# Patient Record
Sex: Male | Born: 1960
Health system: Southern US, Community
[De-identification: ages and names within clinical notes are randomized; demographics above are authoritative.]

## PROBLEM LIST (undated history)

## (undated) DIAGNOSIS — G5603 Carpal tunnel syndrome, bilateral upper limbs: Secondary | ICD-10-CM

## (undated) DIAGNOSIS — J301 Allergic rhinitis due to pollen: Secondary | ICD-10-CM

## (undated) DIAGNOSIS — E119 Type 2 diabetes mellitus without complications: Secondary | ICD-10-CM

## (undated) HISTORY — DX: Allergic rhinitis due to pollen: J30.1

## (undated) HISTORY — PX: POLYPECTOMY: SHX149

## (undated) HISTORY — DX: Type 2 diabetes mellitus without complications: E11.9

## (undated) HISTORY — DX: Carpal tunnel syndrome, bilateral upper limbs: G56.03

---
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
## 2000-03-20 ENCOUNTER — Encounter: Payer: Self-pay | Admitting: Family Medicine

## 2000-03-20 ENCOUNTER — Encounter: Admission: RE | Admit: 2000-03-20 | Discharge: 2000-03-20 | Payer: Self-pay | Admitting: Family Medicine

## 2000-03-22 ENCOUNTER — Encounter: Admission: RE | Admit: 2000-03-22 | Discharge: 2000-03-22 | Payer: Self-pay | Admitting: Family Medicine

## 2000-03-22 ENCOUNTER — Encounter: Payer: Self-pay | Admitting: Family Medicine

## 2002-10-01 HISTORY — PX: FOOT SURGERY: SHX648

## 2005-08-09 ENCOUNTER — Other Ambulatory Visit: Payer: Self-pay

## 2005-08-09 ENCOUNTER — Inpatient Hospital Stay: Payer: Self-pay | Admitting: Internal Medicine

## 2005-12-06 ENCOUNTER — Ambulatory Visit: Payer: Self-pay | Admitting: Internal Medicine

## 2007-12-04 ENCOUNTER — Ambulatory Visit: Payer: Self-pay | Admitting: Family Medicine

## 2007-12-11 ENCOUNTER — Telehealth (INDEPENDENT_AMBULATORY_CARE_PROVIDER_SITE_OTHER): Payer: Self-pay | Admitting: Internal Medicine

## 2008-07-01 ENCOUNTER — Telehealth: Payer: Self-pay | Admitting: Internal Medicine

## 2008-07-02 ENCOUNTER — Ambulatory Visit: Payer: Self-pay | Admitting: Family Medicine

## 2008-07-02 DIAGNOSIS — G5600 Carpal tunnel syndrome, unspecified upper limb: Secondary | ICD-10-CM | POA: Insufficient documentation

## 2008-07-07 ENCOUNTER — Encounter: Payer: Self-pay | Admitting: Family Medicine

## 2009-12-26 ENCOUNTER — Ambulatory Visit: Payer: Self-pay | Admitting: Family Medicine

## 2009-12-30 ENCOUNTER — Telehealth: Payer: Self-pay | Admitting: Family Medicine

## 2010-01-04 ENCOUNTER — Telehealth: Payer: Self-pay | Admitting: Family Medicine

## 2010-01-16 ENCOUNTER — Ambulatory Visit: Payer: Self-pay | Admitting: Internal Medicine

## 2010-01-25 ENCOUNTER — Telehealth: Payer: Self-pay | Admitting: Internal Medicine

## 2010-05-24 ENCOUNTER — Ambulatory Visit: Payer: Self-pay | Admitting: Internal Medicine

## 2010-05-29 ENCOUNTER — Telehealth: Payer: Self-pay | Admitting: Internal Medicine

## 2010-06-29 ENCOUNTER — Ambulatory Visit: Payer: Self-pay | Admitting: Pulmonary Disease

## 2010-06-29 DIAGNOSIS — R05 Cough: Secondary | ICD-10-CM

## 2010-06-29 DIAGNOSIS — R059 Cough, unspecified: Secondary | ICD-10-CM | POA: Insufficient documentation

## 2010-07-13 ENCOUNTER — Ambulatory Visit: Payer: Self-pay | Admitting: Pulmonary Disease

## 2010-10-31 NOTE — Assessment & Plan Note (Signed)
Summary: consult for chronic cough   Visit Type:  Initial Consult Copy to:  Tillman Abide MD Primary Provider/Referring Provider:  Tillman Abide MD  CC:  chronic cough.  History of Present Illness: The pt is a 50y/o male who I have been asked to see for chronic cough.  He has had a cough for 3-4 years, and when usually comes on it takes about 3 mos to go away.  The trigger is usually temperature changes, dust exposures, strong odors, and then the cough escalates from there.  This particular cough he has currently started about 8 weeks ago, and he has been treated with amoxacillin, avelox since that time.  His cough is productive of very little mucus when it occurs, but can sometimes be discolored.  He currently rates his cough a 2/10, with 10 being the worse most recently.  The pt has constant throat clearing during the interview, and states this is an ongoing issue as well.  He describes a tickle in his throat.  He denies postnasal drip or sinus symptoms, and does not think he has reflux disease.  He has no h/o childhood asthma, and has not seen any change in his exertional tolerance.   Current Medications (verified): 1)  Claritin 10 Mg Tabs (Loratadine) .... 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 CTS, B, confirmed on EMG  Past Surgical History: Appendectomy  Family History: Reviewed history from 01/16/2010 and no changes required. Heart disease--mother,father Face cancer--father  Social History: Reviewed history from 01/16/2010 and no changes required. 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  The patient denies shortness of breath at rest, coughing up blood, chest pain, irregular heartbeats, acid heartburn, indigestion, loss of appetite, abdominal pain, difficulty swallowing, sore throat, tooth/dental problems, headaches, nasal congestion/difficulty breathing through nose, sneezing, itching, ear ache, anxiety, depression, hand/feet swelling, joint stiffness or pain, rash, and fever.    Vital Signs:  Patient profile:   50 year old male Height:      71 inches Weight:      321.13 pounds O2 Sat:      96 % on Room air Temp:     98.2 degrees F oral Pulse rate:   78 / minute BP sitting:   118 / 82  (right arm) Cuff size:   large  Vitals Entered By: Carver Fila (June 29, 2010 11:18 AM)  O2 Flow:  Room air CC: chronic cough Comments meds and allergies updated Phone number updated Carver Fila  June 29, 2010 11:23 AM    Physical Exam  General:  obese male in nad Eyes:  PERRLA and EOMI.   Nose:  patent without discharge, no purulence or inflammatory changes. Mouth:  no lesions or exudates. Neck:  no jvd, tmg, LN Lungs:  totally clear to auscultation no wheezing or rhonchi Heart:  rrr, no mrg Abdomen:  soft and nontender, bs+ Extremities:  no significant edema or cyanosis  pulses intact distally Neurologic:  alert and oriented, moves all 4.   Impression & Recommendations:  Problem # 1:  COUGH, CHRONIC (  ICD-786.2) the pt's cough sounds more upper airway in origin than lower.  He has a tickle in his throat, has chronic throat clearing, and his history of how this occurs sounds classical for a cyclical cough.  The pt may be one of these who has a hypersensitized upper airway, and propagates his cough once he starts due to any environmental exposure or overuse of voice.  Postnasal drip and reflux are also common  hypersensitizers of the upper airway.  I would like to treat him with the cyclical cough protocol, and have also reviewed the various behavioral therapies he will need as well.  Will also treat emperically for possible postnasal drip and reflux.  Medications Added to Medication List This Visit: 1)  Claritin 10 Mg Tabs (Loratadine) .... 2 tablets once daily 2)  Tussicaps 10-8 Mg Xr12h-cap (Hydrocod polst-chlorphen polst) .... One every 12 hrs as needed 3)  Tessalon Perles 100 Mg Caps (Benzonatate) .... One to two by mouth 3-4 times daily  Other Orders: T-2 View CXR (71020TC) Consultation Level V (13086)  Patient Instructions: 1)  see cyclical cough protocol...see sheet. 2)  once protocol is done after 3 days, slowly get back into normal life with voice limitation and hard candy 3)  dexilant 60mg  one each am until next visit. 4)  once the 3 days on protocol are completed, get chlorpheniramine 8mg  one at bedtime until next visit with me.  stop claritin for now 5)  will check cxr today 6)  followup with me in 2 weeks.  Prescriptions: TESSALON PERLES 100 MG  CAPS (BENZONATATE) One to two by mouth 3-4 times daily  #30 x 1   Entered and Authorized by:   Barbaraann Share MD   Signed by:   Barbaraann Share MD on 06/29/2010   Method used:   Print then Give to Patient   RxID:   [id number] TUSSICAPS 10-8 MG XR12H-CAP (HYDROCOD POLST-CHLORPHEN POLST) one every 12 hrs as needed  #10 x 0   Entered and Authorized by:   Barbaraann Share MD   Signed by:   Barbaraann Share MD on 06/29/2010   Method used:   Print then Give to Patient   RxID:   [id number]

## 2010-10-31 NOTE — Assessment & Plan Note (Signed)
Summary: 2:00 COUGH X 1 MTH/CLE   Vital Signs:  Patient profile:   50 year old male Weight:      329 pounds O2 Sat:      97 % on Room air Temp:     98.2 degrees F tympanic Pulse rate:   96 / minute Pulse rhythm:   regular Resp:     16 per minute BP sitting:   160 / 90  (left arm) Cuff size:   large  Vitals Entered By: Mervin Hack CMA Duncan Dull) (January 16, 2010 2:06 PM)  O2 Flow:  Room air CC: cough   History of Present Illness: Having cough for about 1 month Productive of green sputum notes it worse when there are air temperature changes No real sore throat No fever No SOB No history of asthma that he knows of  tried mucinex D regularly--seems to help loosen the phlegm given z-pak then doxy seems some worse at night--out coaching baseball  fluids seem to thicken as he swallows them--like he needs to spit it out  Had been doing elliptical and weights till about 2 months ago (then had schedule change)   Preventive Screening-Counseling & Management  Alcohol-Tobacco     Smoking Status: never  Allergies: No Known Drug Allergies  Past History:  Past Medical History: Reviewed history from 07/02/2008 and no changes required. 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  NAD Fairly constant coarse cough Head:  no sinus tenderness Ears:  R ear normal and L ear normal.   Nose:  mild congestion only Mouth:  no erythema and no exudates.   Neck:  supple, no masses, no thyromegaly, no carotid bruits, and no cervical lymphadenopathy.   Lungs:  normal respiratory effort, no intercostal retractions, no accessory muscle use, no dullness, no  crackles, and no wheezes.   Decreased breath sounds but clear Additional Exam:  CXR shows prominent bronchial markings but no pneumonia Spirometry is normal   Impression & Recommendations:  Problem # 1:  COUGH (ICD-786.2) Assessment New Persistent is concerning no troubling findings on CXR Spirometry normal but still may have bronchospasm component  if not better with this, would refer to pulmonary  Orders: Spirometry w/Graph (94010) CXR- 2view (CXR)  Complete Medication List: 1)  Levaquin 750 Mg Tabs (Levofloxacin) .Marland Kitchen.. 1 tab daily for bronchitis 2)  Prednisone 20 Mg Tabs (Prednisone) .... 2 tablets daily for cough. 3)  Tramadol Hcl 50 Mg Tabs (Tramadol hcl) .Marland Kitchen.. 1-2 tabs at bedtime as needed for cough  Patient Instructions: 1)  Please call if you are not improving in the next week---we will make referral to lung specialist 2)  Please try loratadine 10mg  1-2 daily and cetirizine 10mg  daily for allergies as needed Prescriptions: TRAMADOL HCL 50 MG TABS (TRAMADOL HCL) 1-2 tabs at bedtime as needed for cough  #30 x 0   Entered and Authorized by:   Cindee Salt MD   Signed by:   Cindee Salt MD on 01/16/2010   Method used:   Electronically to        [facility]. # [phone]* (retail)       [address]  [place], [place]  [address]       Ph: [phone]       Fax: [phone]   RxID:   [id number] PREDNISONE 20 MG TABS (PREDNISONE) 2 tablets daily for cough.  #10 x 0   Entered and Authorized by:   Cindee Salt MD   Signed by:   Cindee Salt MD on 01/16/2010   Method used:   Electronically to        [facility]. # [id number]* (retail)       [address]       [place], [place]  [address]       Ph: [phone]       Fax: [phone]   RxID:   [id number] LEVAQUIN 750 MG TABS (LEVOFLOXACIN) 1 tab daily for bronchitis  #7 x 0   Entered and Authorized by:   Cindee Salt MD   Signed by:   Cindee Salt MD on 01/16/2010   Method used:   Electronically to        [facility]. # [phone]* (retail)       [address]       [place], [place]  [address]       Ph: [phone]       Fax: [phone]   RxID:   [id number]   Current Allergies (reviewed today): No known allergies

## 2010-10-31 NOTE — Progress Notes (Signed)
Summary: Cough  Phone Note Call from Patient Call back at [facility] Phone [phone]   Caller: Patient Call For: Dr.Demaya Hardge Summary of Call: pt calling stating that he still had a terrible cough and coughing up "green stuff" pt is still taking 2 Tramadol at night and finished all other abx. He would like referral to lung specialist. Please advise. Initial call taken by: Mervin Hack CMA Duncan Dull),  January 25, 2010 3:04 PM  Follow-up for Phone Call        please refer to  pulmonary Follow-up by: Cindee Salt MD,  January 25, 2010 8:16 PM

## 2010-10-31 NOTE — Progress Notes (Signed)
Summary: tongue is irritated  Phone Note Call from Patient Call back at Home Phone [phone]   Caller: Spouse Call For: Derrick Beat MD Summary of Call: Pt has been taking doxycycline since last friday.  On saturday his tongue started getting really sore and sides are yellow.  Feels raw.  Could this be a side effect of the doxy?  He still has cough and congestion, uses [facility] in [place]. Initial call taken by: Lowella Petties CMA,  January 04, 2010 2:24 PM  Follow-up for Phone Call        Throat and tongue irritation is reported.  Uncommon, but theoretically possible. May or may not be related.   Ok to stop medication. He has completed a full course of azithromycin and several days of additional doxy.   post-inflammatory resp symptoms can be expected for 2 weeks after patient "feels better." As long as afebrile, no additional ABX needed Follow-up by: Derrick Beat MD,  January 04, 2010 3:00 PM  Additional Follow-up for Phone Call Additional follow up Details #1::        Advised pt. Additional Follow-up by: Lowella Petties CMA,  January 04, 2010 3:03 PM

## 2010-10-31 NOTE — Assessment & Plan Note (Signed)
Summary: COUGHING UP GREEN MUCUS / LFW   Vital Signs:  Patient profile:   50 year old male Weight:      324 pounds Temp:     98.1 degrees F oral Pulse rate:   82 / minute Pulse rhythm:   regular Resp:     12 per minute BP sitting:   140 / 100  (left arm) Cuff size:   large  Vitals Entered By: Mervin Hack CMA Duncan Dull) (May 24, 2010 11:30 AM) CC: cough   History of Present Illness: Feels sick again not as bad as in the spring [place] and really started him up  within 36 hours, he started with green mucus No fever No sig SOB No head congestion or drainage no ear pain or sore throat  hadn't had any cough or SOB since last visit No cough at night Does note problems with exposure to fine particulate matter like aerosol or dust has to ride in front on 4 wheeler No seasonal allergies  Allergies: No Known Drug Allergies  Past History:  Family History: Last updated: 01/16/2010 No FH of asthma  Social History: Last updated: 01/16/2010 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  Past Medical History: Reviewed history from 07/02/2008 and no changes required. CTS, B, confirmed on EMG  Family History: Reviewed history from 01/16/2010 and no changes required. No FH of asthma  Social History: Reviewed history from 01/16/2010 and no changes required. 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  NAD Head:  no sinus tenderness Ears:  R ear normal and L ear normal.   Nose:  no sig inflammation or sweliing Mouth:  no erythema, no exudates, and no lesions.     Neck:  supple, no masses, and no cervical lymphadenopathy.   Lungs:  normal respiratory effort, no intercostal retractions, no accessory muscle use, normal breath sounds, no dullness, no crackles, and no wheezes.     Impression & Recommendations:  Problem # 1:  BRONCHITIS- ACUTE (ICD-466.0) Assessment New seems to be triggered by particulate matter discussed that if he has any ongoing symptoms, or recurrent illness, should consider inhaled steroids  will treat with amoxil change to quinolone by Monday if not much better  Complete Medication List: 1)  Amoxicillin 500 Mg Tabs (Amoxicillin) .... 2 tabs by mouth two times a day for bronchitis  Patient Instructions: 1)  Please schedule a follow-up appointment in 6-9  months for physical 2)  Please call on Monday if bronchitis is not significantly better Prescriptions: AMOXICILLIN 500 MG TABS (AMOXICILLIN) 2 tabs by mouth two times a day for bronchitis  #40 x 0   Entered and Authorized by:   Cindee Salt MD   Signed by:   Cindee Salt MD on 05/24/2010   Method used:   Electronically to        [facility]. # [phone]* (retail)       [address]       [place], [place]  [address]       Ph: [phone]       Fax: [phone]  RxID:   [id number]   Prior Medications: Current Allergies (reviewed today): No known allergies   Appended Document: COUGHING UP GREEN MUCUS / LFW Script for amox was cancelled at [facility], pt got a script from his dentist 2 days before this appt.  And now, he has been given avolox.

## 2010-10-31 NOTE — Progress Notes (Signed)
Summary: cough is no better  Phone Note Call from Patient Call back at [facility] Phone [phone]   Caller: Patient Call For: Dr.Letvak  Summary of Call: Patient states that his cough has not improved at all. He says that he is coughing up more green mucus than before. Uses [facility] in [place]. Initial call taken by: Melody Comas,  May 29, 2010 1:11 PM  Follow-up for Phone Call        Please let him know new antibiotic Rx sent to [facility] Follow-up by: Cindee Salt MD,  May 29, 2010 1:50 PM  Additional Follow-up for Phone Call Additional follow up Details #1::        Spoke with patient and advised results.  Additional Follow-up by: Mervin Hack CMA Duncan Dull),  May 29, 2010 3:48 PM    New/Updated Medications: AVELOX 400 MG TABS (MOXIFLOXACIN HCL) 1 tab daily for sinus infection Prescriptions: AVELOX 400 MG TABS (MOXIFLOXACIN HCL) 1 tab daily for sinus infection  #10 x 0   Entered and Authorized by:   Cindee Salt MD   Signed by:   Cindee Salt MD on 05/29/2010   Method used:   Electronically to        [facility]. # [phone]* (retail)       [address]       [place], [place]  [address]       Ph: [phone]       Fax: [phone]   RxID:   [id number]

## 2010-10-31 NOTE — Assessment & Plan Note (Signed)
Summary: rov for cough   Visit Type:  Follow-up Copy to:  Tillman Abide MD Primary Niani Mourer/Referring Jaquell Seddon:  Tillman Abide MD  CC:  follow up. pt states cough is getting a little better but still not gone. Pt states he has a productive with light green-medium green phlem. Pt states the cyclical did help some but pt states he feels like he has something stuck in his throat.  pt states he took the dexialnt but couldn't really tell a difference. pt is never smoker. Marland Kitchen  History of Present Illness: the pt comes in today for f/u of his chronic cough.  At the last visit, his cough was felt to be more upper airway in origin, and he was started on the cyclical cough protocol.  He reports today that his cough is down to a 2/10, with 10 being the level at the last visit.  He still has a tickle in his throat, and feels there is something "stuck there".  He produces very little mucus.  He did not see a difference with the PPI, but his cough is 80% better.  Of note, his cxr last visit showed no acute process.  Current Medications (verified): 1)  Tessalon Perles 100 Mg  Caps (Benzonatate) .... One To Two By Mouth 3-4 Times Daily  Allergies (verified): No Known Drug Allergies  Review of Systems       The patient complains of productive cough.  The patient denies shortness of breath with activity, shortness of breath at rest, coughing up blood, chest pain, irregular heartbeats, acid heartburn, indigestion, loss of appetite, weight change, abdominal pain, difficulty swallowing, sore throat, tooth/dental problems, headaches, nasal congestion/difficulty breathing through nose, sneezing, itching, ear ache, anxiety, depression, hand/feet swelling, joint stiffness or pain, rash, change in color of mucus, and fever.    Vital Signs:  Patient profile:   50 year old male Height:      71 inches Weight:      316.38 pounds BMI:     44.29 O2 Sat:      98 % on Room air Temp:     97.7 degrees F oral Pulse rate:    76 / minute BP sitting:   114 / 82  (left arm) Cuff size:   large  Vitals Entered By: Carver Fila (July 13, 2010 9:52 AM)  O2 Flow:  Room air CC: follow up. pt states cough is getting a little better but still not gone. Pt states he has a productive with light green-medium green phlem. Pt states the cyclical did help some but pt states he feels like he has something stuck in his throat.  pt states he took the dexialnt but couldn't really tell a difference. pt is never smoker.  Comments meds and allergies updated Phone number updated Carver Fila  July 13, 2010 9:52 AM    Physical Exam  General:  obese male in nad Nose:  no purulence or discharge noted. Lungs:  clear to auscultation. Heart:  rrr Extremities:  no edema or cyanosis  Neurologic:  alert and oriented, moves all 4.   Impression & Recommendations:  Problem # 1:  COUGH, CHRONIC (ICD-786.2)  the pt feels that his cough is 80% better from the last visit.  He is still continuously clearing his throat during our visit today, and complains of a classic globus sensation.  The options are to give behavioral therapy more time since it is helping vs.  proceeding with further w/u such as ENT evaluation/ct sinuses/spirometry.  I  would recommend the former, and the pt agrees.  I have asked him to continue with hard candy, no throat clearing, and to minimize voice use.  He is to let me know if his cough does not continue to improve.  Other Orders: Est. Patient Level III (01093)  Patient Instructions: 1)  will continue with behavioral therapy for your cough...do not clear your throat, hard candy, voice limitation. 2)  ok to go back to claritin for your allergies. 3)  if your cough does not resolve, or if escalates again, will need to consider breathing tests, ENT evaluation, sinus xrays.  Please call me if this occurs.

## 2010-10-31 NOTE — Assessment & Plan Note (Signed)
Summary: coughing/ alc   Vital Signs:  Patient profile:   50 year old male Height:      71 inches Weight:      328.2 pounds BMI:     45.94 Temp:     98.2 degrees F tympanic Pulse rate:   64 / minute Pulse rhythm:   regular BP sitting:   110 / 76  (left arm) Cuff size:   large  Vitals Entered By: Benny Lennert CMA Duncan Dull) (December 26, 2009 12:12 PM)  History of Present Illness: Chief complaint cough  Acute Visit History:      The patient complains of cough, headache, musculoskeletal symptoms, nasal discharge, and sinus problems.  These symptoms began 1 week ago.  He denies abdominal pain, chest pain, earache, nausea, and sore throat.        The cough interferes with his sleep.  The character of the cough is described as productive.  He has no history of COPD.  There is no history of wheezing, shortness of breath, respiratory retractions, tachypnea, cyanosis, or interference with oral intake associated with his cough.        He complains of sinus pressure, nasal congestion, and purulent drainage.        Urine output has been normal.  He is tolerating clear liquids.        Allergies (verified): No Known Drug Allergies  Past History:  Past medical, surgical, family and social histories (including risk factors) reviewed, and no changes noted (except as noted below).  Past Medical History: Reviewed history from 07/02/2008 and no changes required. CTS, B, confirmed on EMG  Family History: Reviewed history and no changes required.  Social History: Reviewed history and no changes required.  Review of Systems       REVIEW OF SYSTEMS GEN: Acute illness details above. CV: No chest pain or SOB GI: No noted N or V Otherwise, pertinent positives and negatives are noted in the HPI.   Physical Exam  Additional Exam:  GEN: A and O x 3. WDWN. NAD.    ENT: Nose clear, ext NML.  No LAD.  No JVD.  TM's clear. Oropharynx clear.  PULM: Normal WOB, no distress. No crackles, no wheezes  with some coarse bs diffusely CV: RRR, no M/G/R, No rubs, No JVD.   ABD: S, NT, ND, + BS. No rebound. No guarding. No HSM.   EXT: warm and well-perfused, No c/c/e. PSYCH: Pleasant and conversant.    Impression & Recommendations:  Problem # 1:  BRONCHITIS- ACUTE (ICD-466.0) Assessment New  His updated medication list for this problem includes:    Azithromycin 250 Mg Tabs (Azithromycin) .Marland Kitchen... 2 by  mouth today and then 1 daily for 4 days    Tussionex Pennkinetic Er 8-10 Mg/37ml Lqcr (Chlorpheniramine-hydrocodone) .Marland Kitchen... 1 tsp by mouth at bedtime as needed cough  Take antibiotics and other medications as directed. Encouraged to push clear liquids, get enough rest, and take acetaminophen as needed. To be seen in 5-7 days if no improvement, sooner if worse.  Complete Medication List: 1)  Azithromycin 250 Mg Tabs (Azithromycin) .... 2 by  mouth today and then 1 daily for 4 days 2)  Tussionex Pennkinetic Er 8-10 Mg/22ml Lqcr (Chlorpheniramine-hydrocodone) .Marland Kitchen.. 1 tsp by mouth at bedtime as needed cough  Patient Instructions: 1)  BRONCHITIS 2)  -Viral or baterial infections of the lung. Fever, cough, chest pain, shortness of breath, phlegm production, fatigue are symptoms. 3)  Treatment: 4)  1. Take all medicines 5)  2. Antibiotics  6)  3. Cough suppressants 7)  4. Bronchodilators: an inhaler 8)  5. Expectorant like Guaifenesin (Robitussin, Mucinex) 9)  Fluids and Moisture help: drink lots of fluids 10)  Vaporizier or humidifier in room, shower steam 11)  --help loosen secretions and sooth breathing passages 12)  Elevate head slightly when trying to sleep.  Prescriptions: TUSSIONEX PENNKINETIC ER 8-10 MG/5ML LQCR (CHLORPHENIRAMINE-HYDROCODONE) 1 tsp by mouth at bedtime as needed cough  #240 mL x 0   Entered and Authorized by:   Hannah Beat MD   Signed by:   Hannah Beat MD on 12/26/2009   Method used:   Print then Give to Patient   RxID:   [id number] AZITHROMYCIN 250 MG   TABS (AZITHROMYCIN) 2 by  mouth today and then 1 daily for 4 days  #6 x 0   Entered and Authorized by:   Hannah Beat MD   Signed by:   Hannah Beat MD on 12/26/2009   Method used:   Print then Give to Patient   RxID:   [id number]   Current Allergies (reviewed today): No known allergies

## 2010-10-31 NOTE — Progress Notes (Signed)
Summary: not feeling any better  Phone Note Call from Patient Call back at [phone]   Caller: Spouse Call For: Dr. Ermalene Searing Summary of Call: Patient was seen on Monday for cough and sinus problems. He was given azithromycin and tussionex. Today is the last day for him to take the Azithromycin. Wife says that patient is still coughing terribly, to the point of gagging,  low grade fever. Wife does not want him to go through the weekend like this. She says that her son was seen on Monday by Dr. Patsy Lager as well for the same issues and was given Doxycycline and he is feeling better now. She wants to know if the Doxycyline or something else can be called in to [facility] in [place].  Initial call taken by: Melody Comas,  December 30, 2009 11:40 AM  Follow-up for Phone Call        If SOB, chest pain..needs to be seen today, work in . If simply not better..I will broden to doxycycline BID  x 10 days.  Follow-up by: Kerby Nora MD,  December 30, 2009 12:23 PM  Additional Follow-up for Phone Call Additional follow up Details #1::        Pt denies SOB or chest pains, just says he isnt better.  Doxycycline 100 mg called to [facility] in [place], one  two times a day x 10 days.  Additional Follow-up by: Lowella Petties CMA,  December 30, 2009 1:04 PM    Additional Follow-up for Phone Call Additional follow up Details #2::    Please enter in EMR to document as telephoned in.  Follow-up by: Kerby Nora MD,  December 30, 2009 2:05 PM  New/Updated Medications: DOXYCYCLINE HYCLATE 100 MG TABS (DOXYCYCLINE HYCLATE) take one two times a day times 10 days Prescriptions: DOXYCYCLINE HYCLATE 100 MG TABS (DOXYCYCLINE HYCLATE) take one two times a day times 10 days  #20 x 0   Entered by:   Lowella Petties CMA   Authorized by:   Kerby Nora MD   Signed by:   Lowella Petties CMA on 12/30/2009   Method used:   Telephoned to ...       [facility] [address] # [phone]* (retail)       [address]       [place], [place]  [address]       Ph: [phone]       Fax: [phone]   RxID:   [id number]   Prior Medications: AZITHROMYCIN 250 MG  TABS (AZITHROMYCIN) 2 by  mouth today and then 1 daily for 4 days TUSSIONEX PENNKINETIC ER 8-10 MG/5ML LQCR (CHLORPHENIRAMINE-HYDROCODONE) 1 tsp by mouth at bedtime as needed cough Current Allergies: No known allergies

## 2010-12-12 ENCOUNTER — Encounter: Payer: Self-pay | Admitting: Internal Medicine

## 2010-12-12 ENCOUNTER — Other Ambulatory Visit: Payer: Self-pay | Admitting: Internal Medicine

## 2010-12-12 ENCOUNTER — Encounter (INDEPENDENT_AMBULATORY_CARE_PROVIDER_SITE_OTHER): Payer: BC Managed Care – PPO | Admitting: Internal Medicine

## 2010-12-12 DIAGNOSIS — Z0000 Encounter for general adult medical examination without abnormal findings: Secondary | ICD-10-CM

## 2010-12-12 DIAGNOSIS — Z23 Encounter for immunization: Secondary | ICD-10-CM

## 2010-12-12 LAB — GLUCOSE, RANDOM: Glucose, Bld: 221 mg/dL — ABNORMAL HIGH (ref 70–99)

## 2010-12-12 LAB — LIPID PANEL
Cholesterol: 178 mg/dL (ref 0–200)
HDL: 39.1 mg/dL (ref >39.00)
LDL Cholesterol: 117 mg/dL — ABNORMAL HIGH (ref 0–99)
Total CHOL/HDL Ratio: 5
Triglycerides: 108 mg/dL (ref 0.0–149.0)
VLDL: 21.6 mg/dL (ref 0.0–40.0)

## 2010-12-19 NOTE — Assessment & Plan Note (Signed)
Summary: CPX/DLO R/S FROM 12/04/10   Vital Signs:  Patient profile:   50 year old male Weight:      312 pounds Temp:     98.3 degrees F oral Pulse rate:   84 / minute Pulse rhythm:   regular BP sitting:   132 / 80  (left arm) Cuff size:   large  Vitals Entered By: Mervin Hack CMA Duncan Dull) (December 12, 2010 11:58 AM) CC: adult physical   History of Present Illness: Has seen Dr Shelle Iron about the cough It is much better Just has to avoid dust It feels like there is something in his throat and he feels he has to clear it uses the claritin No water brash some help from the loratadine  Has skin tags in axillae no pain and doesn't bother him  Ties to work out with sons weight is down some   Allergies: No Known Drug Allergies  Past History:  Past medical, surgical, family and social histories (including risk factors) reviewed for relevance to current acute and chronic problems.  Past Medical History: Bilateral carpal tunnel syndrome,  confirmed on EMG  Past Surgical History: Appendectomy Right heel reconstruction   11-Mar-2003  Family History: Dad died of squamous cell skin cancer. Heart valve problem  Mom in rehab for broken bone bone. Had carotid surgery 5 siblings 1 brother with IDDM No clear CAD, HTN No prostate or colon cancer  Social History: Reviewed history from 01/16/2010 and no changes required. Occupation:  Owns own Scientific laboratory technician.  Married----3 children Never Smoked Alcohol use-no  Review of Systems General:  weight is down 30-40# over the past few years tries to watch eating sleeps okay in general wears seat belt. Eyes:  Denies double vision and vision loss-1 eye. ENT:  Denies decreased hearing and ringing in ears; teeth okay--sees dentist (has some cavities). CV:  Denies chest pain or discomfort, difficulty breathing at night, difficulty breathing while lying down, fainting, lightheadness, palpitations, and shortness of  breath with exertion. Resp:  Complains of cough; denies shortness of breath. GI:  Denies abdominal pain, bloody stools, change in bowel habits, dark tarry stools, indigestion, nausea, and vomiting. MS:  Complains of joint pain; denies joint swelling; wears wrist splints at night for CTS. Derm:  Denies lesion(s) and rash; small brownish spots on skin. Neuro:  Complains of numbness; denies headaches and weakness; hand numbness from CTS. Psych:  Denies anxiety and depression. Heme:  Denies abnormal bruising and enlarge lymph nodes. Allergy:  See HPI.  Physical Exam  General:  alert and normal appearance.   Eyes:  pupils equal, pupils round, pupils reactive to light, and no optic disk abnormalities.   Ears:  R ear normal and L ear normal.   Mouth:  no erythema, no exudates, and no lesions.   Neck:  supple, no masses, no thyromegaly, no carotid bruits, and no cervical lymphadenopathy.   Lungs:  normal respiratory effort, no intercostal retractions, no accessory muscle use, and normal breath sounds.   Heart:  normal rate, regular rhythm, no murmur, and no gallop.   Abdomen:  soft, non-tender, and no masses.   Msk:  no joint tenderness and no joint swelling.   Normal muscles in thenar eminences Pulses:  1+ in feet Extremities:  no edema Neurologic:  alert & oriented X3, strength normal in all extremities, and gait normal.   Skin:  no rashes and no suspicious lesions.   Scattered benign nevi Small axillary skin tags Axillary Nodes:  No palpable  lymphadenopathy Psych:  normally interactive, good eye contact, not anxious appearing, and not depressed appearing.     Impression & Recommendations:  Problem # 1:  PREVENTIVE HEALTH CARE (ICD-V70.0) Assessment Comment Only  healthy but needs to continue to work on fitness he has made sig gains Tdap today check glucose and lipids  Orders: Venipuncture (16109) TLB-Lipid Panel (80061-LIPID) TLB-Glucose, QUANT (82947-GLU)  Complete  Medication List: 1)  Claritin 10 Mg Tabs (Loratadine) .... Take 1 by mouth once daily  Other Orders: Tdap => 37yrs IM (60454) Admin 1st Vaccine (09811)  Patient Instructions: 1)  Consider adding fexofenadine 180mg  daily or cetirizine 10mg  daily to the loratadine 2)  Please schedule a follow-up appointment in 1 year.    Orders Added: 1)  Est. Patient 40-64 years [99396] 2)  Venipuncture [91478] 3)  TLB-Lipid Panel [80061-LIPID] 4)  TLB-Glucose, QUANT [82947-GLU] 5)  Tdap => 66yrs IM [90715] 6)  Admin 1st Vaccine [29562]   Immunizations Administered:  Tetanus Vaccine:    Vaccine Type: Tdap    Site: left deltoid    Mfr: GlaxoSmithKline    Dose: 0.5 ml    Route: IM    Given by: Mervin Hack CMA (AAMA)    Exp. Date: 08/24/2012    Lot #: [id number]    VIS given: 08/18/08 version given December 12, 2010.   Immunizations Administered:  Tetanus Vaccine:    Vaccine Type: Tdap    Site: left deltoid    Mfr: GlaxoSmithKline    Dose: 0.5 ml    Route: IM    Given by: Mervin Hack CMA (AAMA)    Exp. Date: 08/24/2012    Lot #: [id number]    VIS given: 08/18/08 version given December 12, 2010.  Current Allergies (reviewed today): No known allergies

## 2010-12-25 ENCOUNTER — Other Ambulatory Visit: Payer: Self-pay | Admitting: Internal Medicine

## 2010-12-26 ENCOUNTER — Other Ambulatory Visit (INDEPENDENT_AMBULATORY_CARE_PROVIDER_SITE_OTHER): Payer: BC Managed Care – PPO | Admitting: Internal Medicine

## 2010-12-26 DIAGNOSIS — R7309 Other abnormal glucose: Secondary | ICD-10-CM

## 2010-12-26 LAB — HEMOGLOBIN A1C: Hgb A1c MFr Bld: 11 % — ABNORMAL HIGH (ref 4.6–6.5)

## 2010-12-26 LAB — GLUCOSE, RANDOM: Glucose, Bld: 214 mg/dL — ABNORMAL HIGH (ref 70–99)

## 2010-12-29 ENCOUNTER — Telehealth: Payer: Self-pay | Admitting: *Deleted

## 2010-12-29 NOTE — Telephone Encounter (Signed)
Message copied by Mervin Hack on Fri Dec 29, 2010  2:33 PM ------      Message from: Tillman Abide      Created: Tue Dec 26, 2010  8:00 PM       Please call      Sugar is still elevated and indicates diabetes      Please set up appt in the next few days (by next week some time) to discuss what we need to do now

## 2010-12-29 NOTE — Telephone Encounter (Signed)
Left message on machine asking pt to return my call

## 2011-01-04 NOTE — Telephone Encounter (Signed)
Spoke with patient and advised results, per patient he will call back and schedule appt for next week.

## 2011-04-12 IMAGING — CR DG CHEST 2V
2 series · 2 of 2 positions shown · non-contrast
Comparison: None.

CLINICAL DATA: Cough.  Nonsmoker

CHEST - 2 VIEW

[view not recorded (1 of 2)]
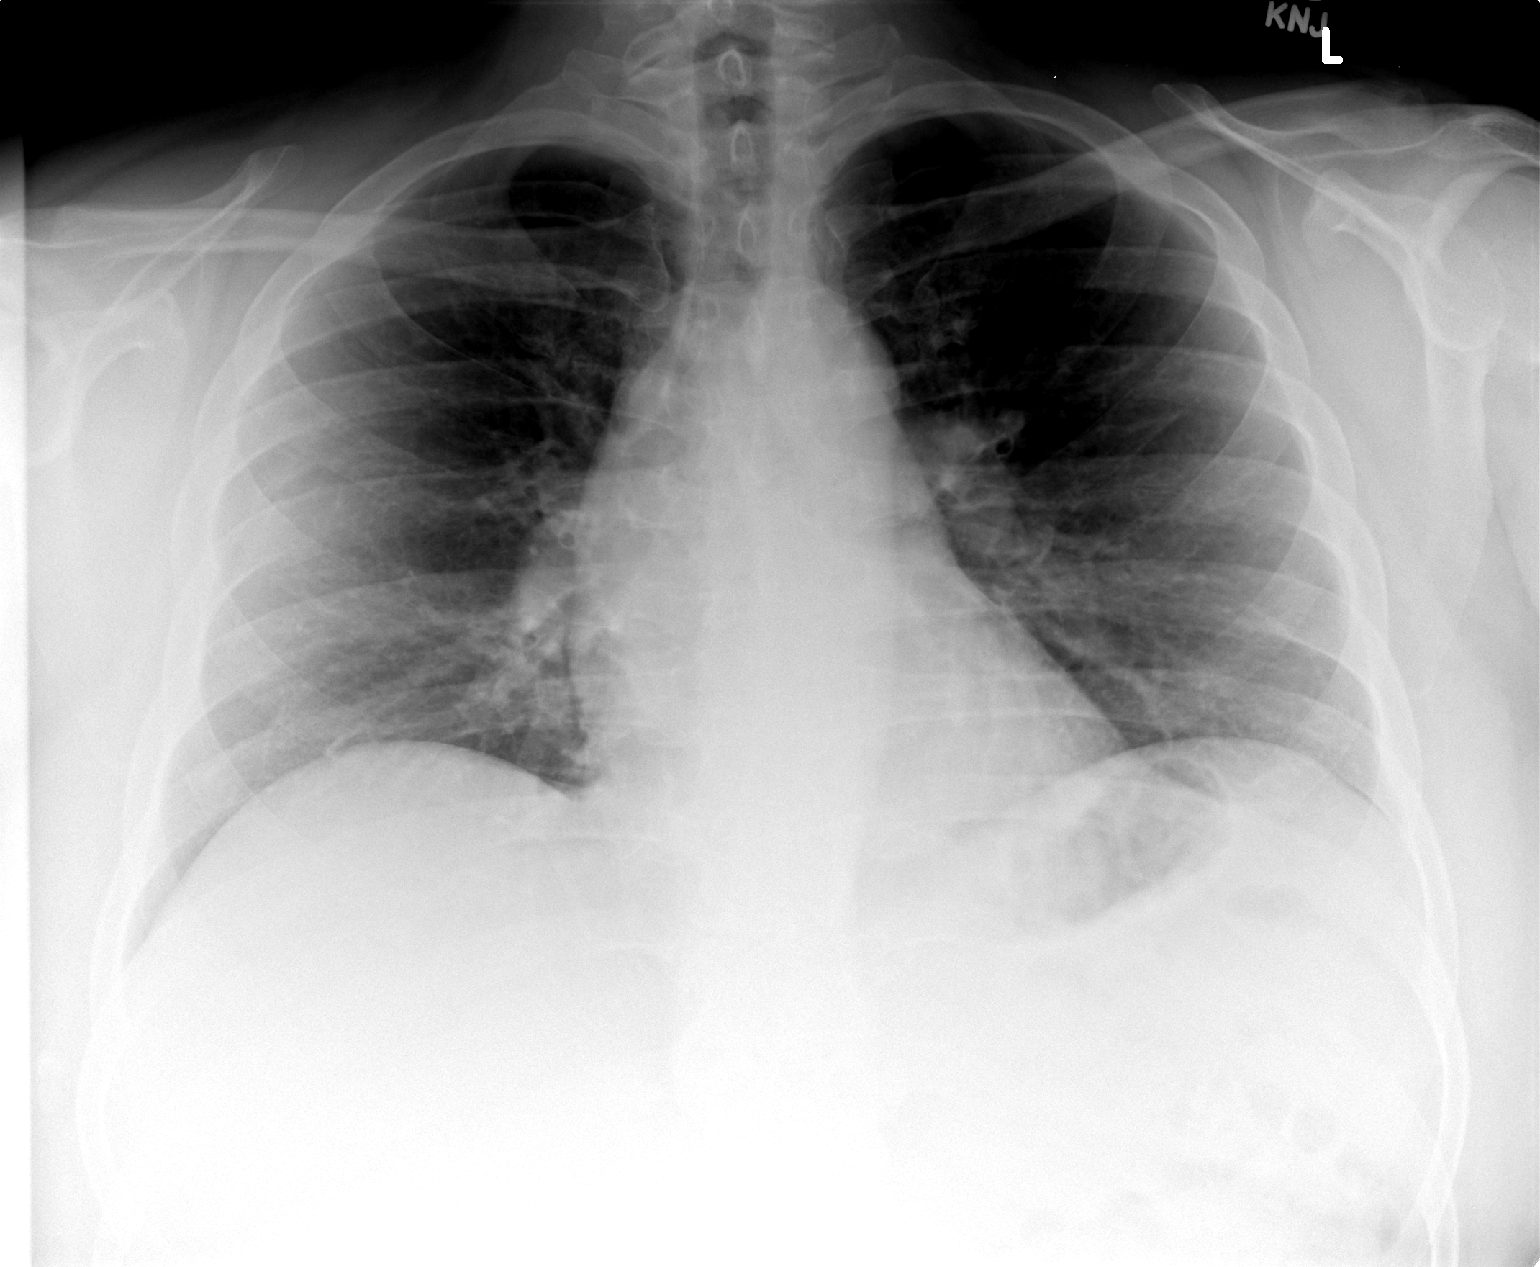

[view not recorded (2 of 2)]
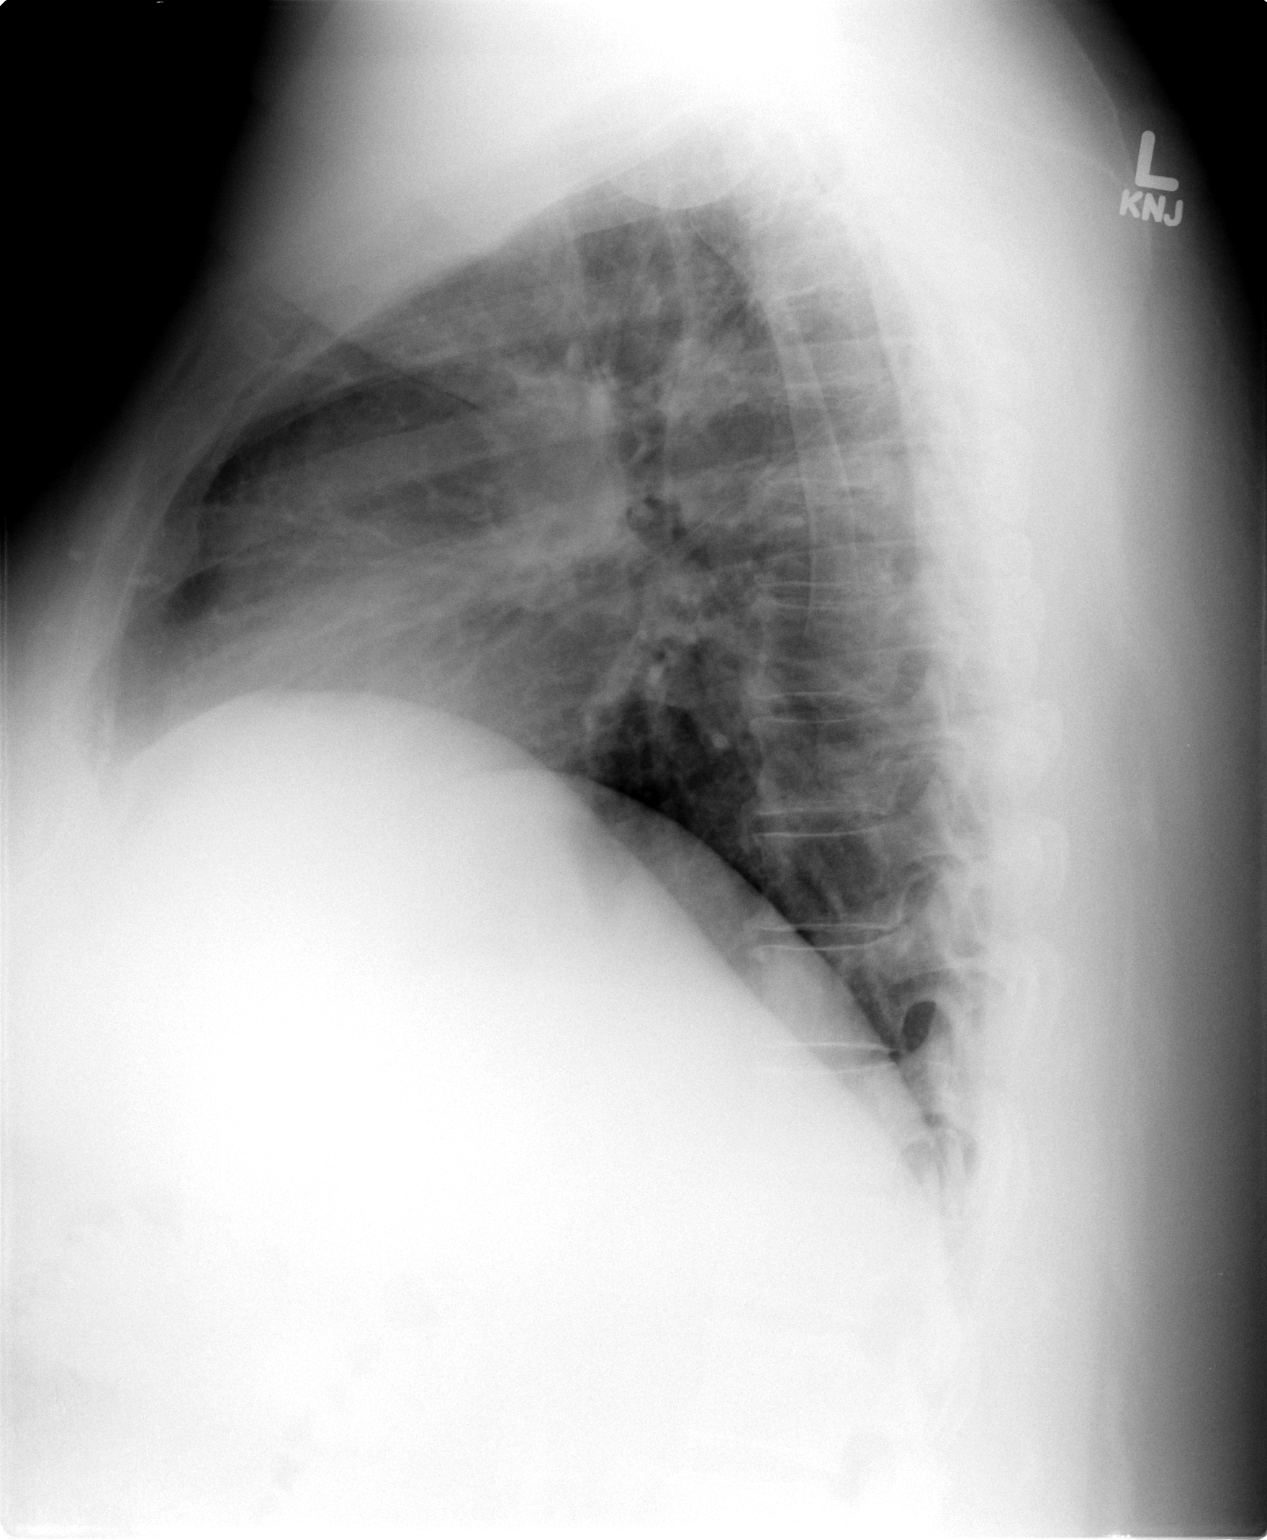

[2 of 2 positions shown; findings below may reference images not displayed]

FINDINGS: Low lung volumes are present.  Taking this into
consideration heart and mediastinal contours are within normal
limits.  There is some mild central peribronchial cuffing
identified and this can be seen with bronchitis.  The lung fields
are otherwise clear with no signs of focal infiltrate or congestive
failure.  No pleural fluid is noted.

Bony structures demonstrate degenerative changes of the mid and
lower thoracic spine and are otherwise intact.
IMPRESSION: Low lung volumes with central mild peribronchial cuffing.  This can
be seen with bronchitis or bronchitic change.  Otherwise clear
lungs

## 2011-12-18 ENCOUNTER — Encounter: Payer: Self-pay | Admitting: Internal Medicine

## 2011-12-19 ENCOUNTER — Encounter: Payer: BC Managed Care – PPO | Admitting: Internal Medicine

## 2012-09-10 ENCOUNTER — Telehealth: Payer: Self-pay | Admitting: Internal Medicine

## 2012-09-10 NOTE — Telephone Encounter (Signed)
Patient Information:  Caller Name: Tywone  Phone: [phone]  Patient: Derrick Kennedy, Derrick Kennedy  Gender: Male  DOB: Apr 29, 1961  Age: 51 Years  PCP: Tillman Abide [facility])  Office Follow Up:  Does the office need to follow up with this patient?: No  Instructions For The Office: N/A  RN Note:  Patient is asking if he can have a medication called in for the symptoms he is having at this time. Reports he gets something like this every year around this same time. Advised patient we do not call in prescriptions for antibiotics over the phone and that the doctors prefer to see the patients before prescribing anything.  Symptoms  Reason For Call & Symptoms: Reports dry cough, nasal congestion, runny nose.  Reviewed Health History In EMR: Yes  Reviewed Medications In EMR: Yes  Reviewed Allergies In EMR: Yes  Reviewed Surgeries / Procedures: Yes  Date of Onset of Symptoms: 09/09/2012  Treatments Tried: Tussin cough syrup, Dayquil  Treatments Tried Worked: No  Guideline(s) Used:  Colds  Disposition Per Guideline:   See Today or Tomorrow in Office  Reason For Disposition Reached:   Patient wants to be seen  Advice Given:  Humidifier:  If the air in your home is dry, use a cool-mist humidifier  Treatment for Associated Symptoms of Colds:  For muscle aches, headaches, or moderate fever (more than 101 F or 38.9 C): Take acetaminophen every 4 hours.  Expected Course:   Nasal discharge 7-14 days  Cough up to 2-3 weeks.  Call Back If:  Difficulty breathing occurs  You become worse  Appointment Scheduled:  09/11/2012 09:00:00 Appointment Scheduled Provider:  [facility])

## 2012-09-11 ENCOUNTER — Encounter: Payer: Self-pay | Admitting: Family Medicine

## 2012-09-11 ENCOUNTER — Ambulatory Visit (INDEPENDENT_AMBULATORY_CARE_PROVIDER_SITE_OTHER): Payer: Self-pay | Admitting: Family Medicine

## 2012-09-11 VITALS — BP 130/86 | HR 76 | Temp 98.2°F | Wt 289.0 lb

## 2012-09-11 DIAGNOSIS — R05 Cough: Secondary | ICD-10-CM

## 2012-09-11 DIAGNOSIS — R059 Cough, unspecified: Secondary | ICD-10-CM

## 2012-09-11 MED ORDER — BENZONATATE 200 MG PO CAPS: 200.0000 mg | ORAL_CAPSULE | Freq: Two times a day (BID) | ORAL | Status: DC | PRN

## 2012-09-11 MED ORDER — HYDROCOD POLST-CHLORPHEN POLST 10-8 MG/5ML PO LQCR: 5.0000 mL | Freq: Two times a day (BID) | ORAL | Status: DC | PRN

## 2012-09-11 NOTE — Patient Instructions (Addendum)
Nice to meet you. Have a Merry Christmas.  Let's use the cough suppressants and call us if no improvement in next week or two.

## 2012-09-11 NOTE — Progress Notes (Signed)
SUBJECTIVE:  Derrick Kennedy is a 51 y.o. male who complains of congestion and dry cough for 3 days. He denies a history of anorexia, chest pain, chills, fatigue, fevers, myalgias, shortness of breath, weakness and weight loss and denies, admits to and does a history of asthma. Patient denies smoke cigarettes.  H/o recurrent chronic cough- saw Dr. Shelle Iron.  Note reviewed.  Triggers include cold temperatures and dust.  He has had no known new dust exposure.  Patient Active Problem List  Diagnosis  . CARPAL TUNNEL SYNDROME (CTS)  . COUGH, CHRONIC   Past Medical History  Diagnosis Date  . Bilateral carpal tunnel syndrome    Past Surgical History  Procedure Date  . Appendectomy   . Foot surgery 2004    right heel reconstruction   History  Substance Use Topics  . Smoking status: Never Smoker   . Smokeless tobacco: Never Used  . Alcohol Use: No   Family History  Problem Relation Age of Onset  . Diabetes Brother   . Cancer Neg Hx    No Known Allergies Current Outpatient Prescriptions on File Prior to Visit  Medication Sig Dispense Refill  . loratadine (CLARITIN) 10 MG tablet Take 10 mg by mouth daily.       The PMH, PSH, Social History, Family History, Medications, and allergies have been reviewed in [facility], and have been updated if relevant.    OBJECTIVE:  BP 130/86  Pulse 76  Temp 98.2 F (36.8 C)  Wt 289 lb (131.09 kg)  He appears well, vital signs are as noted. Ears normal.  Throat and pharynx normal.  Neck supple. No adenopathy in the neck. Nose is congested. Sinuses non tender. The chest is clear, without wheezes or rales.  ASSESSMENT:  Cyclical cough  PLAN: Symptomatic therapy suggested: Tussionex and tessalon as needed, push fluids, rest and return office visit prn if symptoms persist or worsen. Lack of antibiotic effectiveness discussed with him. Call or return to clinic prn if these symptoms worsen or fail to improve as anticipated.

## 2013-02-04 ENCOUNTER — Encounter: Payer: Self-pay | Admitting: Internal Medicine

## 2013-02-04 ENCOUNTER — Ambulatory Visit (INDEPENDENT_AMBULATORY_CARE_PROVIDER_SITE_OTHER): Payer: BC Managed Care – PPO | Admitting: Internal Medicine

## 2013-02-04 VITALS — BP 132/72 | HR 81 | Temp 98.6°F | Wt 289.0 lb

## 2013-02-04 DIAGNOSIS — G5601 Carpal tunnel syndrome, right upper limb: Secondary | ICD-10-CM

## 2013-02-04 DIAGNOSIS — G5600 Carpal tunnel syndrome, unspecified upper limb: Secondary | ICD-10-CM

## 2013-02-04 NOTE — Assessment & Plan Note (Signed)
Now has clear worsening Will set up with hand surgeon Seems to be ready for surgery

## 2013-02-04 NOTE — Progress Notes (Signed)
  Subjective:    Patient ID: Derrick Kennedy, male    DOB: 30-Jan-1961, 52 y.o.   MRN: [id number]  HPI Having problems with right hand Using use of it Feels swollen but it isn't Numbness in entire palm----occ all the way up to the elbow May have pain in 3rd and 4th fingers if he hits thumb the wrong way  Pain is waking him up at times He sleeps with arm bent and that causes increased pain Has been using brace--not helping  Does use his hands regularly at work  No current outpatient prescriptions on file prior to visit.   No current facility-administered medications on file prior to visit.    No Known Allergies  Past Medical History  Diagnosis Date  . Bilateral carpal tunnel syndrome     Past Surgical History  Procedure Laterality Date  . Appendectomy    . Foot surgery  2004    right heel reconstruction    Family History  Problem Relation Age of Onset  . Diabetes Brother   . Cancer Neg Hx     History   Social History  . Marital Status: Married    Spouse Name: N/A    Number of Children: 3  . Years of Education: N/A   Occupational History  . owns company Engineer, structural    Social History Main Topics  . Smoking status: Never Smoker   . Smokeless tobacco: Never Used  . Alcohol Use: No  . Drug Use: No  . Sexually Active: Not on file   Other Topics Concern  . Not on file   Social History Narrative  . No narrative on file   Review of Systems Feels well otherwise Weight stable     Objective:   Physical Exam  Musculoskeletal:  No joint swelling  Neurological:  ?subtle decrease in right grip strength Numbness increased almost immediately when he makes a fist          Assessment & Plan:

## 2013-08-10 ENCOUNTER — Encounter: Payer: BC Managed Care – PPO | Admitting: Internal Medicine

## 2013-08-10 ENCOUNTER — Encounter: Payer: Self-pay | Admitting: Internal Medicine

## 2013-08-10 ENCOUNTER — Ambulatory Visit (INDEPENDENT_AMBULATORY_CARE_PROVIDER_SITE_OTHER): Payer: BC Managed Care – PPO | Admitting: Internal Medicine

## 2013-08-10 VITALS — BP 130/88 | HR 89 | Temp 98.2°F | Ht 69.4 in | Wt 255.1 lb

## 2013-08-10 DIAGNOSIS — E1169 Type 2 diabetes mellitus with other specified complication: Secondary | ICD-10-CM | POA: Insufficient documentation

## 2013-08-10 DIAGNOSIS — E119 Type 2 diabetes mellitus without complications: Secondary | ICD-10-CM

## 2013-08-10 DIAGNOSIS — Z23 Encounter for immunization: Secondary | ICD-10-CM

## 2013-08-10 DIAGNOSIS — E1142 Type 2 diabetes mellitus with diabetic polyneuropathy: Secondary | ICD-10-CM | POA: Insufficient documentation

## 2013-08-10 DIAGNOSIS — Z125 Encounter for screening for malignant neoplasm of prostate: Secondary | ICD-10-CM

## 2013-08-10 DIAGNOSIS — Z0000 Encounter for general adult medical examination without abnormal findings: Secondary | ICD-10-CM | POA: Insufficient documentation

## 2013-08-10 DIAGNOSIS — Z1211 Encounter for screening for malignant neoplasm of colon: Secondary | ICD-10-CM

## 2013-08-10 DIAGNOSIS — J301 Allergic rhinitis due to pollen: Secondary | ICD-10-CM | POA: Insufficient documentation

## 2013-08-10 LAB — HEPATIC FUNCTION PANEL
ALT: 80 U/L — ABNORMAL HIGH (ref 0–53)
Albumin: 3.8 g/dL (ref 3.5–5.2)
Alkaline Phosphatase: 40 U/L (ref 39–117)
Total Protein: 7.5 g/dL (ref 6.0–8.3)

## 2013-08-10 LAB — CBC WITH DIFFERENTIAL/PLATELET
Basophils Absolute: 0 10*3/uL (ref 0.0–0.1)
Basophils Relative: 0.4 % (ref 0.0–3.0)
Eosinophils Absolute: 0.1 10*3/uL (ref 0.0–0.7)
HCT: 42.8 % (ref 39.0–52.0)
Hemoglobin: 14.4 g/dL (ref 13.0–17.0)
Lymphocytes Relative: 34.3 % (ref 12.0–46.0)
Lymphs Abs: 2.3 10*3/uL (ref 0.7–4.0)
MCHC: 33.8 g/dL (ref 30.0–36.0)
Monocytes Relative: 7.6 % (ref 3.0–12.0)
Neutro Abs: 3.8 10*3/uL (ref 1.4–7.7)
Platelets: 314 10*3/uL (ref 150.0–400.0)
RDW: 13.6 % (ref 11.5–14.6)

## 2013-08-10 LAB — BASIC METABOLIC PANEL
BUN: 12 mg/dL (ref 6–23)
Chloride: 97 mEq/L (ref 96–112)
GFR: 134.89 mL/min (ref >60.00)
Glucose, Bld: 246 mg/dL — ABNORMAL HIGH (ref 70–99)
Potassium: 4.6 mEq/L (ref 3.5–5.1)
Sodium: 135 mEq/L (ref 135–145)

## 2013-08-10 LAB — LIPID PANEL
Cholesterol: 177 mg/dL (ref 0–200)
HDL: 40 mg/dL (ref >39.00)
LDL Cholesterol: 117 mg/dL — ABNORMAL HIGH (ref 0–99)
Triglycerides: 102 mg/dL (ref 0.0–149.0)
VLDL: 20.4 mg/dL (ref 0.0–40.0)

## 2013-08-10 LAB — HEMOGLOBIN A1C: Hgb A1c MFr Bld: 10.5 % — ABNORMAL HIGH (ref 4.6–6.5)

## 2013-08-10 LAB — TSH: TSH: 3.48 u[IU]/mL (ref 0.35–5.50)

## 2013-08-10 NOTE — Assessment & Plan Note (Signed)
Lost to follow up 2 years ago Lost 60# Episodic finger sticks are okay Will start metformin if A1c over 7.5% Needs eye exam

## 2013-08-10 NOTE — Assessment & Plan Note (Signed)
Good work on [employer] loss---has a long way to go Flu/pneumovax Check PSA and do colonoscopy after discussion

## 2013-08-10 NOTE — Addendum Note (Signed)
Addended by: Baldomero Lamy on: 08/10/2013 10:53 AM   Modules accepted: Orders

## 2013-08-10 NOTE — Patient Instructions (Signed)
Please set up an eye exam soon--- you need to be checked for diabetic changes.

## 2013-08-10 NOTE — Progress Notes (Signed)
Subjective:    Patient ID: Derrick Kennedy, male    DOB: 1961/03/03, 52 y.o.   MRN: [id number]  HPI Here for physical  Never had the wrist operated on  Still with some symptoms  Does check his sugars randomly with wife's meter AM is usually 120 or so May be 140-160 randomly Has lost 60# since 2 years ago Not really compliant with diet  No current outpatient prescriptions on file prior to visit.   No current facility-administered medications on file prior to visit.    No Known Allergies  Past Medical History  Diagnosis Date  . Bilateral carpal tunnel syndrome   . Type II or unspecified type diabetes mellitus without mention of complication, not stated as uncontrolled     Past Surgical History  Procedure Laterality Date  . Appendectomy    . Foot surgery  2004    right heel reconstruction    Family History  Problem Relation Age of Onset  . Diabetes Brother   . Cancer Neg Hx     History   Social History  . Marital Status: Married    Spouse Name: N/A    Number of Children: 3  . Years of Education: N/A   Occupational History  . owns company Engineer, structural    Social History Main Topics  . Smoking status: Never Smoker   . Smokeless tobacco: Never Used  . Alcohol Use: No  . Drug Use: No  . Sexual Activity: Not on file   Other Topics Concern  . Not on file   Social History Narrative  . No narrative on file   Review of Systems  Constitutional: Negative for fatigue and unexpected weight change.       Wears seat belt  HENT: Positive for congestion and rhinorrhea. Negative for dental problem, hearing loss and tinnitus.        Regular with dentist  Eyes: Negative for visual disturbance.       No diplopia or unilateral vision loss  Respiratory: Negative for cough, chest tightness and shortness of breath.   Cardiovascular: Negative for chest pain, palpitations and leg swelling.  Gastrointestinal: Negative for nausea, vomiting, abdominal pain,  constipation and blood in stool.       No heartburn  Endocrine: Positive for polyuria. Negative for cold intolerance, heat intolerance and polydipsia.  Genitourinary: Negative for urgency, frequency and difficulty urinating.       No sexual problems  Musculoskeletal: Negative for arthralgias, back pain and joint swelling.  Skin: Negative for rash.       No suspicious lesions  Allergic/Immunologic: Positive for environmental allergies. Negative for immunocompromised state.       Uses loratadine with success  Neurological: Positive for numbness. Negative for dizziness, syncope, weakness, light-headedness and headaches.       Hand symptoms  Hematological: Negative for adenopathy. Does not bruise/bleed easily.  Psychiatric/Behavioral: Negative for sleep disturbance and dysphoric mood. The patient is not nervous/anxious.        Objective:   Physical Exam  Constitutional: He is oriented to person, place, and time. He appears well-developed and well-nourished. No distress.  HENT:  Head: Normocephalic and atraumatic.  Right Ear: External ear normal.  Left Ear: External ear normal.  Mouth/Throat: Oropharynx is clear and moist. No oropharyngeal exudate.  Eyes: Conjunctivae and EOM are normal. Pupils are equal, round, and reactive to light.  Neck: Normal range of motion. Neck supple. No thyromegaly present.  Cardiovascular: Normal rate, regular rhythm, normal heart sounds  and intact distal pulses.  Exam reveals no gallop.   No murmur heard. Pulmonary/Chest: Effort normal and breath sounds normal. No respiratory distress. He has no wheezes. He has no rales.  Abdominal: Soft. There is no tenderness.  Musculoskeletal: He exhibits no edema and no tenderness.  Lymphadenopathy:    He has no cervical adenopathy.  Neurological: He is alert and oriented to person, place, and time.  Sensation normal in feet  Skin: No rash noted. No erythema.  No foot ulcers or lesions  Psychiatric: He has a normal  mood and affect. His behavior is normal.          Assessment & Plan:

## 2013-08-17 ENCOUNTER — Other Ambulatory Visit: Payer: Self-pay | Admitting: *Deleted

## 2013-08-17 ENCOUNTER — Encounter: Payer: Self-pay | Admitting: *Deleted

## 2013-08-17 MED ORDER — METFORMIN HCL 1000 MG PO TABS: 1000.0000 mg | ORAL_TABLET | Freq: Two times a day (BID) | ORAL | Status: DC

## 2013-09-10 ENCOUNTER — Telehealth: Payer: Self-pay | Admitting: Internal Medicine

## 2013-09-10 NOTE — Telephone Encounter (Signed)
Pt left voicemail with triage. Pt said he has been taking metformin for about a month and every time pt takes the medication it gives him diarrhea. Also pt is still checking his sugar and the medication isn't helping, he has also tried to change his eating habits, but his sugar levels haven't changed

## 2013-09-10 NOTE — Telephone Encounter (Signed)
Have him try metformin ER 500mg  2 daily before breakfast #60 x 11 This is usually easier on the bowels  Also, add glipizide 5mg  bid #60 x 11 Make sure he has a follow up coming up soon

## 2013-09-11 MED ORDER — METFORMIN HCL ER 500 MG PO TB24: 500.0000 mg | ORAL_TABLET | Freq: Two times a day (BID) | ORAL | Status: DC

## 2013-09-11 MED ORDER — GLIPIZIDE 5 MG PO TABS: 5.0000 mg | ORAL_TABLET | Freq: Every day | ORAL | Status: DC

## 2013-09-11 NOTE — Telephone Encounter (Signed)
Spoke with patient and advised results rx sent to pharmacy by e-script  

## 2013-09-21 ENCOUNTER — Ambulatory Visit (AMBULATORY_SURGERY_CENTER): Payer: BC Managed Care – PPO | Admitting: *Deleted

## 2013-09-21 VITALS — Ht 69.5 in | Wt 282.8 lb

## 2013-09-21 DIAGNOSIS — Z1211 Encounter for screening for malignant neoplasm of colon: Secondary | ICD-10-CM

## 2013-09-21 MED ORDER — MOVIPREP 100 G PO SOLR: ORAL | Status: DC

## 2013-09-21 NOTE — Progress Notes (Signed)
No allergies to eggs or soy. No problems with anesthesia.  

## 2013-09-22 ENCOUNTER — Encounter: Payer: Self-pay | Admitting: Internal Medicine

## 2013-10-05 ENCOUNTER — Encounter: Payer: Self-pay | Admitting: Internal Medicine

## 2013-10-05 ENCOUNTER — Ambulatory Visit (AMBULATORY_SURGERY_CENTER): Payer: BC Managed Care – PPO | Admitting: Internal Medicine

## 2013-10-05 VITALS — BP 133/82 | HR 72 | Temp 98.1°F | Resp 15 | Ht 69.5 in | Wt 282.0 lb

## 2013-10-05 DIAGNOSIS — D126 Benign neoplasm of colon, unspecified: Secondary | ICD-10-CM

## 2013-10-05 DIAGNOSIS — Z1211 Encounter for screening for malignant neoplasm of colon: Secondary | ICD-10-CM

## 2013-10-05 HISTORY — PX: COLONOSCOPY: SHX174

## 2013-10-05 MED ORDER — SODIUM CHLORIDE 0.9 % IV SOLN: 500.0000 mL | INTRAVENOUS | Status: DC

## 2013-10-05 NOTE — Progress Notes (Signed)
Called to room to assist during endoscopic procedure.  Patient ID and intended procedure confirmed with present staff. Received instructions for my participation in the procedure from the performing physician.  

## 2013-10-05 NOTE — Progress Notes (Signed)
Procedure ends, to recovery, report goven and VSS.

## 2013-10-05 NOTE — Patient Instructions (Signed)

## 2013-10-05 NOTE — Op Note (Signed)
[place]  [address]. [place], [address]   COLONOSCOPY PROCEDURE REPORT  PATIENT: Derrick Kennedy, Derrick Kennedy  MR#: [id number] BIRTHDATE: May 06, 1961 , 52  yrs. old GENDER: Male ENDOSCOPIST: Jerene Bears, MD REFERRED OF:BPZWCHE Silvio Pate, M.D. PROCEDURE DATE:  10/05/2013 PROCEDURE:   Colonoscopy with snare polypectomy First Screening Colonoscopy - Avg.  risk and is 50 yrs.  old or older Yes.  Prior Negative Screening - Now for repeat screening. N/A  History of Adenoma - Now for follow-up colonoscopy & has been > or = to 3 yrs.  N/A  Polyps Removed Today? Yes. ASA CLASS:   Class II INDICATIONS:average risk screening and first colonoscopy. MEDICATIONS: MAC sedation, administered by CRNA and Propofol (Diprivan) 290 mg IV  DESCRIPTION OF PROCEDURE:   After the risks benefits and alternatives of the procedure were thoroughly explained, informed consent was obtained.  A digital rectal exam revealed no rectal mass.   The LB [id number]  endoscope was introduced through the anus and advanced to the terminal ileum which was intubated for a short distance. No adverse events experienced.   The quality of the prep was good, using MoviPrep  The instrument was then slowly withdrawn as the colon was fully examined.   COLON FINDINGS: The mucosa appeared normal in the terminal ileum. A sessile polyp measuring 5 mm in size was found at the hepatic flexure.  A polypectomy was performed with a cold snare.  The resection was complete and the polyp tissue was completely retrieved.   Mild diverticulosis was noted in the descending colon and sigmoid colon.  Retroflexed views revealed no abnormalities. The time to cecum=4 minutes 13 seconds.  Withdrawal time=12 minutes 54 seconds.  The scope was withdrawn and the procedure completed. COMPLICATIONS: There were no complications.  ENDOSCOPIC IMPRESSION: 1.   Normal mucosa in the terminal ileum 2.   Sessile polyp measuring 5 mm in size was  found at the hepatic flexure; polypectomy was performed with a cold snare 3.   Mild diverticulosis was noted in the descending colon and sigmoid colon  RECOMMENDATIONS: 1.  Await pathology results 2.  High fiber diet 3.  If the polyp removed today is proven to be an adenomatous (pre-cancerous) polyp, you will need a repeat colonoscopy in 5 years.  Otherwise you should continue to follow colorectal cancer screening guidelines for "routine risk" patients with colonoscopy in 10 years.  You will receive a letter within 1-2 weeks with the results of your biopsy as well as final recommendations.  Please call my office if you have not received a letter after 3 weeks.   eSigned:  Jerene Bears, MD 10/05/2013 2:06 PM      cc: The Patient and Venia Carbon, MD

## 2013-10-06 ENCOUNTER — Telehealth: Payer: Self-pay | Admitting: *Deleted

## 2013-10-06 NOTE — Telephone Encounter (Signed)
  Lm on voice mail that identifies pt by first and last name to return call if questions or problems ewm

## 2013-10-08 ENCOUNTER — Encounter: Payer: Self-pay | Admitting: Internal Medicine

## 2013-10-26 LAB — HM DIABETES EYE EXAM

## 2013-10-29 LAB — HM DIABETES EYE EXAM

## 2013-11-10 ENCOUNTER — Ambulatory Visit (INDEPENDENT_AMBULATORY_CARE_PROVIDER_SITE_OTHER): Payer: BC Managed Care – PPO | Admitting: Internal Medicine

## 2013-11-10 ENCOUNTER — Encounter: Payer: Self-pay | Admitting: Internal Medicine

## 2013-11-10 VITALS — BP 140/80 | HR 80 | Temp 98.1°F | Wt 281.0 lb

## 2013-11-10 DIAGNOSIS — E1149 Type 2 diabetes mellitus with other diabetic neurological complication: Secondary | ICD-10-CM

## 2013-11-10 DIAGNOSIS — E1142 Type 2 diabetes mellitus with diabetic polyneuropathy: Secondary | ICD-10-CM | POA: Insufficient documentation

## 2013-11-10 LAB — HEMOGLOBIN A1C: HEMOGLOBIN A1C: 8 % — AB (ref 4.6–6.5)

## 2013-11-10 NOTE — Assessment & Plan Note (Signed)
Clearly not under control Will check A1c If still over 9%, I will set up with Dr Cruzita Lederer

## 2013-11-10 NOTE — Progress Notes (Signed)
Pre-visit discussion using our clinic review tool. No additional management support is needed unless otherwise documented below in the visit note.  

## 2013-11-10 NOTE — Progress Notes (Signed)
   Subjective:    Patient ID: Derrick Kennedy, male    DOB: 12/01/1960, 53 y.o.   MRN: [id number]  HPI Doing "fantastic" No diarrhea with the metformin ER Only eats 2 meals a day--up 6:30AM but doesn't eat till at least noon  Trying to eat healthy Still very high at night at times--- 315 yesterday Over 200 fasting this AM and usually 180-230 in AM  Trying to walk more Pain in both Achilles tendons Having tingling in his toes now  Current Outpatient Prescriptions on File Prior to Visit  Medication Sig Dispense Refill  . glipiZIDE (GLUCOTROL) 5 MG tablet Take 1 tablet (5 mg total) by mouth daily before breakfast.  60 tablet  11  . loratadine (CLARITIN) 10 MG tablet Take 10 mg by mouth as needed for allergies.      . metFORMIN (GLUCOPHAGE-XR) 500 MG 24 hr tablet Take 1 tablet (500 mg total) by mouth 2 (two) times daily.  60 tablet  11   No current facility-administered medications on file prior to visit.    No Known Allergies  Past Medical History  Diagnosis Date  . Bilateral carpal tunnel syndrome   . Type II or unspecified type diabetes mellitus without mention of complication, not stated as uncontrolled   . Allergic rhinitis due to pollen     Past Surgical History  Procedure Laterality Date  . Appendectomy  1976  . Foot surgery  2004    right heel reconstruction    Family History  Problem Relation Age of Onset  . Diabetes Brother   . Cancer Neg Hx   . Colon cancer Neg Hx     History   Social History  . Marital Status: Married    Spouse Name: N/A    Number of Children: 3  . Years of Education: N/A   Occupational History  . owns company Theatre manager    Social History Main Topics  . Smoking status: Never Smoker   . Smokeless tobacco: Never Used  . Alcohol Use: No  . Drug Use: No  . Sexual Activity: Yes   Other Topics Concern  . Not on file   Social History Narrative  . No narrative on file   Review of Systems Weight is  stable "Crazy" workload-- lots of installations out of town    Objective:   Physical Exam  Musculoskeletal:  Tenderness over both Achilles Maintained arches  Neurological:  Normal strength in feet          Assessment & Plan:

## 2013-11-10 NOTE — Patient Instructions (Signed)

## 2013-11-10 NOTE — Assessment & Plan Note (Signed)
Early sensory changes Need to get sugars under control

## 2013-11-12 ENCOUNTER — Telehealth: Payer: Self-pay

## 2013-11-12 NOTE — Telephone Encounter (Signed)
Relevant patient education assigned to patient using Emmi. ° °

## 2013-11-13 ENCOUNTER — Encounter: Payer: Self-pay | Admitting: *Deleted

## 2013-11-18 ENCOUNTER — Telehealth: Payer: Self-pay

## 2013-11-18 NOTE — Telephone Encounter (Signed)
Pt wanted to know if metabolic panel was done 46/56; advised pt A1C was done in Feb. Pt said Dr Margaretmary Eddy ortho is schedluling pt for surgery and pt thought had metabolic panel done in Feb. Pt said will have blood test done next week with Dr Tamala Julian.

## 2013-11-23 ENCOUNTER — Ambulatory Visit: Payer: Self-pay | Admitting: Anesthesiology

## 2013-11-23 LAB — BASIC METABOLIC PANEL
ANION GAP: 3 — AB (ref 7–16)
BUN: 14 mg/dL (ref 7–18)
Calcium, Total: 9 mg/dL (ref 8.5–10.1)
Chloride: 105 mmol/L (ref 98–107)
Co2: 29 mmol/L (ref 21–32)
Creatinine: 0.79 mg/dL (ref 0.60–1.30)
EGFR (Non-African Amer.): >60
GLUCOSE: 153 mg/dL — AB (ref 65–99)
Osmolality: 277 (ref 275–301)
POTASSIUM: 4.2 mmol/L (ref 3.5–5.1)
Sodium: 137 mmol/L (ref 136–145)

## 2013-11-24 ENCOUNTER — Ambulatory Visit: Payer: Self-pay | Admitting: Specialist

## 2013-11-24 HISTORY — PX: CARPAL TUNNEL RELEASE: SHX101

## 2013-12-08 ENCOUNTER — Encounter: Payer: Self-pay | Admitting: Internal Medicine

## 2014-01-13 ENCOUNTER — Encounter: Payer: Self-pay | Admitting: Internal Medicine

## 2014-02-09 ENCOUNTER — Ambulatory Visit: Payer: BC Managed Care – PPO | Admitting: Internal Medicine

## 2014-04-13 LAB — HM DIABETES EYE EXAM

## 2014-04-14 ENCOUNTER — Telehealth: Payer: Self-pay

## 2014-04-14 NOTE — Telephone Encounter (Signed)
Pt left v/m requesting last A1C result. l left v/m 11/10/2013 A1C was 8.0.

## 2014-04-20 ENCOUNTER — Ambulatory Visit (INDEPENDENT_AMBULATORY_CARE_PROVIDER_SITE_OTHER): Payer: BC Managed Care – PPO | Admitting: Internal Medicine

## 2014-04-20 ENCOUNTER — Encounter: Payer: Self-pay | Admitting: *Deleted

## 2014-04-20 ENCOUNTER — Encounter: Payer: Self-pay | Admitting: Internal Medicine

## 2014-04-20 VITALS — BP 122/80 | HR 74 | Temp 98.0°F | Wt 284.0 lb

## 2014-04-20 DIAGNOSIS — E1149 Type 2 diabetes mellitus with other diabetic neurological complication: Secondary | ICD-10-CM

## 2014-04-20 DIAGNOSIS — E1142 Type 2 diabetes mellitus with diabetic polyneuropathy: Secondary | ICD-10-CM

## 2014-04-20 LAB — LIPID PANEL
CHOL/HDL RATIO: 4
Cholesterol: 160 mg/dL (ref 0–200)
HDL: 36.6 mg/dL — ABNORMAL LOW (ref >39.00)
LDL Cholesterol: 106 mg/dL — ABNORMAL HIGH (ref 0–99)
NONHDL: 123.4
Triglycerides: 87 mg/dL (ref 0.0–149.0)
VLDL: 17.4 mg/dL (ref 0.0–40.0)

## 2014-04-20 LAB — HM DIABETES FOOT EXAM

## 2014-04-20 LAB — MICROALBUMIN / CREATININE URINE RATIO
CREATININE, U: 162.3 mg/dL
MICROALB/CREAT RATIO: 0.5 mg/g (ref 0.0–30.0)
Microalb, Ur: 0.8 mg/dL (ref 0.0–1.9)

## 2014-04-20 LAB — HEMOGLOBIN A1C: HEMOGLOBIN A1C: 7 % — AB (ref 4.6–6.5)

## 2014-04-20 NOTE — Progress Notes (Signed)
Pre visit review using our clinic review tool, if applicable. No additional management support is needed unless otherwise documented below in the visit note. 

## 2014-04-20 NOTE — Progress Notes (Signed)
   Subjective:    Patient ID: Derrick Kennedy, male    DOB: [birth year], 53 y.o.   MRN: [id number]  HPI Here for follow up Feels good Has been working hard--but not working out  Checks sugars at various times 98-160--- usually under 130 fasting No hypoglycemic reactions  No chest pain No SOB No dizziness or syncope  Still feels he eats too many starch and carbs  Current Outpatient Prescriptions on File Prior to Visit  Medication Sig Dispense Refill  . glipiZIDE (GLUCOTROL) 5 MG tablet Take 1 tablet (5 mg total) by mouth daily before breakfast.  60 tablet  11  . loratadine (CLARITIN) 10 MG tablet Take 10 mg by mouth as needed for allergies.      . metFORMIN (GLUCOPHAGE-XR) 500 MG 24 hr tablet Take 1 tablet (500 mg total) by mouth 2 (two) times daily.  60 tablet  11   No current facility-administered medications on file prior to visit.    No Known Allergies  Past Medical History  Diagnosis Date  . Bilateral carpal tunnel syndrome   . Type II or unspecified type diabetes mellitus without mention of complication, not stated as uncontrolled   . Allergic rhinitis due to pollen     Past Surgical History  Procedure Laterality Date  . Appendectomy  1976  . Foot surgery  2004    right heel reconstruction  . Carpal tunnel release Bilateral 11/24/13    Dr Margaretmary Eddy    Family History  Problem Relation Age of Onset  . Diabetes Brother   . Cancer Neg Hx   . Colon cancer Neg Hx     History   Social History  . Marital Status: Married    Spouse Name: N/A    Number of Children: 3  . Years of Education: N/A   Occupational History  . owns company Theatre manager    Social History Main Topics  . Smoking status: Never Smoker   . Smokeless tobacco: Never Used  . Alcohol Use: No  . Drug Use: No  . Sexual Activity: Yes   Other Topics Concern  . Not on file   Social History Narrative  . No narrative on file   Review of Systems Sleeps well since CTS  surgery Weight stable    Objective:   Physical Exam  Constitutional: He appears well-developed and well-nourished. No distress.  Neck: Normal range of motion. Neck supple. No thyromegaly present.  Cardiovascular: Normal rate, regular rhythm, normal heart sounds and intact distal pulses.  Exam reveals no gallop.   No murmur heard. Pulmonary/Chest: Effort normal and breath sounds normal. No respiratory distress. He has no wheezes. He has no rales.  Musculoskeletal: He exhibits no edema and no tenderness.  Lymphadenopathy:    He has no cervical adenopathy.  Skin: No rash noted. No erythema.  No foot lesions  Psychiatric: He has a normal mood and affect. His behavior is normal.          Assessment & Plan:

## 2014-04-20 NOTE — Assessment & Plan Note (Signed)
Discussed lifestyle.

## 2014-04-20 NOTE — Patient Instructions (Signed)
Exercise to Lose Weight Exercise and a healthy diet may help you lose weight. Your doctor may suggest specific exercises. EXERCISE IDEAS AND TIPS  Choose low-cost things you enjoy doing, such as walking, bicycling, or exercising to workout videos.  Take stairs instead of the elevator.  Walk during your lunch break.  Park your car further away from work or school.  Go to a gym or an exercise class.  Start with 5 to 10 minutes of exercise each day. Build up to 30 minutes of exercise 4 to 6 days a week.  Wear shoes with good support and comfortable clothes.  Stretch before and after working out.  Work out until you breathe harder and your heart beats faster.  Drink extra water when you exercise.  Do not do so much that you hurt yourself, feel dizzy, or get very short of breath. Exercises that burn about 150 calories:  Running 1  miles in 15 minutes.  Playing volleyball for 45 to 60 minutes.  Washing and waxing a car for 45 to 60 minutes.  Playing touch football for 45 minutes.  Walking 1  miles in 35 minutes.  Pushing a stroller 1  miles in 30 minutes.  Playing basketball for 30 minutes.  Raking leaves for 30 minutes.  Bicycling 5 miles in 30 minutes.  Walking 2 miles in 30 minutes.  Dancing for 30 minutes.  Shoveling snow for 15 minutes.  Swimming laps for 20 minutes.  Walking up stairs for 15 minutes.  Bicycling 4 miles in 15 minutes.  Gardening for 30 to 45 minutes.  Jumping rope for 15 minutes.  Washing windows or floors for 45 to 60 minutes. Document Released: 10/20/2010 Document Revised: 12/10/2011 Document Reviewed: 10/20/2010 [facility] Patient Information 2015 [place], [place]. This information is not intended to replace advice given to you by your health care provider. Make sure you discuss any questions you have with your health care provider. DASH Eating Plan DASH stands for "Dietary Approaches to Stop Hypertension." The DASH eating plan is a  healthy eating plan that has been shown to reduce high blood pressure (hypertension). Additional health benefits may include reducing the risk of type 2 diabetes mellitus, heart disease, and stroke. The DASH eating plan may also help with weight loss. WHAT DO I NEED TO KNOW ABOUT THE DASH EATING PLAN? For the DASH eating plan, you will follow these general guidelines:  Choose foods with a percent daily value for sodium of less than 5% (as listed on the food label).  Use salt-free seasonings or herbs instead of table salt or sea salt.  Check with your health care provider or pharmacist before using salt substitutes.  Eat lower-sodium products, often labeled as "lower sodium" or "no salt added."  Eat fresh foods.  Eat more vegetables, fruits, and low-fat dairy products.  Choose whole grains. Look for the word "whole" as the first word in the ingredient list.  Choose fish and skinless chicken or [place] more often than red meat. Limit fish, poultry, and meat to 6 oz (170 g) each day.  Limit sweets, desserts, sugars, and sugary drinks.  Choose heart-healthy fats.  Limit cheese to 1 oz (28 g) per day.  Eat more home-cooked food and less restaurant, buffet, and fast food.  Limit fried foods.  Cook foods using methods other than frying.  Limit canned vegetables. If you do use them, rinse them well to decrease the sodium.  When eating at a restaurant, ask that your food be prepared with  less salt, or no salt if possible. WHAT FOODS CAN I EAT? Seek help from a dietitian for individual calorie needs. Grains Whole grain or whole wheat bread. Brown rice. Whole grain or whole wheat pasta. Quinoa, bulgur, and whole grain cereals. Low-sodium cereals. Corn or whole wheat flour tortillas. Whole grain cornbread. Whole grain crackers. Low-sodium crackers. Vegetables Fresh or frozen vegetables (raw, steamed, roasted, or grilled). Low-sodium or reduced-sodium tomato and vegetable juices. Low-sodium  or reduced-sodium tomato sauce and paste. Low-sodium or reduced-sodium canned vegetables.  Fruits All fresh, canned (in natural juice), or frozen fruits. Meat and Other Protein Products Ground beef (85% or leaner), grass-fed beef, or beef trimmed of fat. Skinless chicken or [place]. Ground chicken or [place]. Pork trimmed of fat. All fish and seafood. Eggs. Dried beans, peas, or lentils. Unsalted nuts and seeds. Unsalted canned beans. Dairy Low-fat dairy products, such as skim or 1% milk, 2% or reduced-fat cheeses, low-fat ricotta or cottage cheese, or plain low-fat yogurt. Low-sodium or reduced-sodium cheeses. Fats and Oils Tub margarines without trans fats. Light or reduced-fat mayonnaise and salad dressings (reduced sodium). Avocado. Safflower, olive, or canola oils. Natural peanut or almond butter. Other Unsalted popcorn and pretzels. The items listed above may not be a complete list of recommended foods or beverages. Contact your dietitian for more options. WHAT FOODS ARE NOT RECOMMENDED? Grains White bread. White pasta. White rice. Refined cornbread. Bagels and croissants. Crackers that contain trans fat. Vegetables Creamed or fried vegetables. Vegetables in a cheese sauce. Regular canned vegetables. Regular canned tomato sauce and paste. Regular tomato and vegetable juices. Fruits Dried fruits. Canned fruit in light or heavy syrup. Fruit juice. Meat and Other Protein Products Fatty cuts of meat. Ribs, chicken wings, bacon, sausage, bologna, salami, chitterlings, fatback, hot dogs, bratwurst, and packaged luncheon meats. Salted nuts and seeds. Canned beans with salt. Dairy Whole or 2% milk, cream, half-and-half, and cream cheese. Whole-fat or sweetened yogurt. Full-fat cheeses or blue cheese. Nondairy creamers and whipped toppings. Processed cheese, cheese spreads, or cheese curds. Condiments Onion and garlic salt, seasoned salt, table salt, and sea salt. Canned and packaged gravies.  Worcestershire sauce. Tartar sauce. Barbecue sauce. Teriyaki sauce. Soy sauce, including reduced sodium. Steak sauce. Fish sauce. Oyster sauce. Cocktail sauce. Horseradish. Ketchup and mustard. Meat flavorings and tenderizers. Bouillon cubes. Hot sauce. Tabasco sauce. Marinades. Taco seasonings. Relishes. Fats and Oils Butter, stick margarine, lard, shortening, ghee, and bacon fat. Coconut, palm kernel, or palm oils. Regular salad dressings. Other Pickles and olives. Salted popcorn and pretzels. The items listed above may not be a complete list of foods and beverages to avoid. Contact your dietitian for more information. WHERE CAN I FIND MORE INFORMATION? National Heart, Lung, and Blood Institute: travelstabloid.com Document Released: 09/06/2011 Document Revised: 09/22/2013 Document Reviewed: 07/22/2013 [facility] Patient Information 2015 [place], [place]. This information is not intended to replace advice given to you by your health care provider. Make sure you discuss any questions you have with your health care provider.

## 2014-04-20 NOTE — Assessment & Plan Note (Addendum)
Better control Will increase metformin if still above 8% Suggested statin---he does not want to take this at this time

## 2014-04-20 NOTE — Assessment & Plan Note (Signed)
No major pain--no meds needed

## 2014-08-10 ENCOUNTER — Encounter: Payer: BC Managed Care – PPO | Admitting: Internal Medicine

## 2014-10-13 ENCOUNTER — Other Ambulatory Visit: Payer: Self-pay | Admitting: Internal Medicine

## 2014-11-01 ENCOUNTER — Other Ambulatory Visit: Payer: Self-pay | Admitting: Internal Medicine

## 2014-11-02 ENCOUNTER — Telehealth: Payer: Self-pay

## 2014-11-02 NOTE — Telephone Encounter (Signed)
PLEASE NOTE: All timestamps contained within this report are represented as [place] Standard Time. CONFIDENTIALTY NOTICE: This fax transmission is intended only for the addressee. It contains information that is legally privileged, confidential or otherwise protected from use or disclosure. If you are not the intended recipient, you are strictly prohibited from reviewing, disclosing, copying using or disseminating any of this information or taking any action in reliance on or regarding this information. If you have received this fax in error, please notify us immediately by telephone so that we can arrange for its return to [place]. Phone: [phone], Toll-Free: [phone], Fax: [phone] Page: 1 of 1 Call Id: [id number] [place] Patient Name: Derrick Kennedy Gender: Male DOB: [birth year] Age: 54 Y 10 M 23 D Return Phone Number: Address: City/State/Zip: [place] Client [place] Night - Client Client Site [place] Physician Viviana Simpler Contact Type Call Call Type Page Only Caller Name Dorian Pod Relationship To Patient Provider Is this call to report lab results? No Return Phone Number Unavailable Initial Comment Caller is IT consultant pharmacy at [phone]. Pt out of medication and needs refill. Nurse Assessment Guidelines Guideline Title Affirmed Question Affirmed Notes Nurse Date/Time (Eastern Time) Disp. Time Eilene Ghazi Time) Disposition Final User 11/01/2014 5:28:21 PM Send to [place], [place] 11/01/2014 5:37:36 PM Paged On Call back to Call [place] 11/01/2014 5:40:11 PM Page Completed Yes Dorene Sorrow After Care Instructions Given Call Event Type User Date / Time Description Paging DoctorName DoctorPhone DateTime Result/Outcome Notes Walker Kehr [phone] 11/01/2014 5:37:36 PM Paged On Call Back  to Call Center Walker Kehr 11/01/2014 5:40:02 PM Spoke with On Call - General The [facility] MD answered the phone and was connected to the pharmacy.

## 2014-11-02 NOTE — Telephone Encounter (Signed)
Looks like his diabetes meds were refilled He is due for his 6 month follow up---maybe really for physical Make sure he sets up at least a follow up in the next 1-2 months

## 2014-11-02 NOTE — Telephone Encounter (Signed)
Patient scheduled appointment for a physical on 12/22/14 at 9:00.  He'll be out of town until then.

## 2014-11-29 ENCOUNTER — Other Ambulatory Visit: Payer: Self-pay | Admitting: Internal Medicine

## 2014-12-22 ENCOUNTER — Ambulatory Visit (INDEPENDENT_AMBULATORY_CARE_PROVIDER_SITE_OTHER): Payer: BLUE CROSS/BLUE SHIELD | Admitting: Internal Medicine

## 2014-12-22 ENCOUNTER — Encounter: Payer: Self-pay | Admitting: Internal Medicine

## 2014-12-22 VITALS — BP 130/80 | HR 80 | Temp 97.9°F | Ht 70.0 in | Wt 303.0 lb

## 2014-12-22 DIAGNOSIS — Z0000 Encounter for general adult medical examination without abnormal findings: Secondary | ICD-10-CM

## 2014-12-22 DIAGNOSIS — E119 Type 2 diabetes mellitus without complications: Secondary | ICD-10-CM | POA: Diagnosis not present

## 2014-12-22 DIAGNOSIS — E785 Hyperlipidemia, unspecified: Secondary | ICD-10-CM | POA: Diagnosis not present

## 2014-12-22 LAB — MICROALBUMIN / CREATININE URINE RATIO
Creatinine,U: 135.5 mg/dL
MICROALB UR: 1.5 mg/dL (ref 0.0–1.9)
MICROALB/CREAT RATIO: 1.1 mg/g (ref 0.0–30.0)

## 2014-12-22 LAB — LIPID PANEL
CHOL/HDL RATIO: 4
Cholesterol: 142 mg/dL (ref 0–200)
HDL: 40.2 mg/dL (ref >39.00)
LDL CALC: 88 mg/dL (ref 0–99)
NonHDL: 101.8
Triglycerides: 70 mg/dL (ref 0.0–149.0)
VLDL: 14 mg/dL (ref 0.0–40.0)

## 2014-12-22 LAB — CBC WITH DIFFERENTIAL/PLATELET
BASOS PCT: 0.5 % (ref 0.0–3.0)
Basophils Absolute: 0 10*3/uL (ref 0.0–0.1)
Eosinophils Absolute: 0.1 10*3/uL (ref 0.0–0.7)
Eosinophils Relative: 2 % (ref 0.0–5.0)
HCT: 37.9 % — ABNORMAL LOW (ref 39.0–52.0)
Hemoglobin: 12.9 g/dL — ABNORMAL LOW (ref 13.0–17.0)
Lymphocytes Relative: 40.2 % (ref 12.0–46.0)
Lymphs Abs: 2.3 10*3/uL (ref 0.7–4.0)
MCHC: 34.1 g/dL (ref 30.0–36.0)
MCV: 90 fl (ref 78.0–100.0)
Monocytes Absolute: 0.6 10*3/uL (ref 0.1–1.0)
Monocytes Relative: 11 % (ref 3.0–12.0)
NEUTROS ABS: 2.6 10*3/uL (ref 1.4–7.7)
NEUTROS PCT: 46.3 % (ref 43.0–77.0)
PLATELETS: 314 10*3/uL (ref 150.0–400.0)
RBC: 4.21 Mil/uL — ABNORMAL LOW (ref 4.22–5.81)
RDW: 14.2 % (ref 11.5–15.5)
WBC: 5.6 10*3/uL (ref 4.0–10.5)

## 2014-12-22 LAB — COMPREHENSIVE METABOLIC PANEL
ALK PHOS: 34 U/L — AB (ref 39–117)
ALT: 56 U/L — AB (ref 0–53)
AST: 35 U/L (ref 0–37)
Albumin: 3.9 g/dL (ref 3.5–5.2)
BILIRUBIN TOTAL: 0.3 mg/dL (ref 0.2–1.2)
BUN: 10 mg/dL (ref 6–23)
CO2: 28 mEq/L (ref 19–32)
CREATININE: 0.73 mg/dL (ref 0.40–1.50)
Calcium: 9.3 mg/dL (ref 8.4–10.5)
Chloride: 104 mEq/L (ref 96–112)
GFR: 119.44 mL/min (ref >60.00)
Glucose, Bld: 150 mg/dL — ABNORMAL HIGH (ref 70–99)
Potassium: 4.4 mEq/L (ref 3.5–5.1)
Sodium: 136 mEq/L (ref 135–145)
Total Protein: 7.3 g/dL (ref 6.0–8.3)

## 2014-12-22 LAB — T4, FREE: FREE T4: 0.67 ng/dL (ref 0.60–1.60)

## 2014-12-22 LAB — HEMOGLOBIN A1C: Hgb A1c MFr Bld: 8.7 % — ABNORMAL HIGH (ref 4.6–6.5)

## 2014-12-22 LAB — HM DIABETES FOOT EXAM

## 2014-12-22 NOTE — Assessment & Plan Note (Signed)
Discussed statin He prefers not

## 2014-12-22 NOTE — Progress Notes (Signed)
Subjective:    Patient ID: Derrick Kennedy, male    DOB: [birth year], 54 y.o.   MRN: [id number]  HPI Here for physical  Concerned due to dad dying of SCC of skin that spread He does have some white spots that concern him Discussed considering yearly derm eval  For about 6-8 weeks--has some pain in under ribs in right flank Better in Am but gets worse after eating Pain with twisting or lying on right side in bed Worse if on ladders No fever, cough or SOB Has rarely taken some OTC meds-- helped him sleep  Not really checking sugars Tries to watch his diet but weight is up 19# since last visit---relates to leg injury and less activity  Generally compliant with meds No numbness in feet, no sores or pain either  Current Outpatient Prescriptions on File Prior to Visit  Medication Sig Dispense Refill  . glipiZIDE (GLUCOTROL) 5 MG tablet TAKE 1 TABLET BY MOUTH EVERY DAY BEFORE BREAKFAST 60 tablet 5  . loratadine (CLARITIN) 10 MG tablet Take 10 mg by mouth as needed for allergies.    . metFORMIN (GLUCOPHAGE-XR) 500 MG 24 hr tablet TAKE 1 TABLET BY MOUTH TWICE DAILY 180 tablet 3   No current facility-administered medications on file prior to visit.    No Known Allergies  Past Medical History  Diagnosis Date  . Bilateral carpal tunnel syndrome   . Type II or unspecified type diabetes mellitus without mention of complication, not stated as uncontrolled   . Allergic rhinitis due to pollen     Past Surgical History  Procedure Laterality Date  . Appendectomy  1976  . Foot surgery  2004    right heel reconstruction  . Carpal tunnel release Bilateral 11/24/13    Dr Margaretmary Eddy    Family History  Problem Relation Age of Onset  . Diabetes Brother   . Cancer Neg Hx   . Colon cancer Neg Hx     History   Social History  . Marital Status: Married    Spouse Name: N/A  . Number of Children: 3  . Years of Education: N/A   Occupational History  . owns company Environmental manager    Social History Main Topics  . Smoking status: Never Smoker   . Smokeless tobacco: Never Used  . Alcohol Use: No  . Drug Use: No  . Sexual Activity: Yes   Other Topics Concern  . Not on file   Social History Narrative       Review of Systems  Constitutional: Positive for unexpected weight change. Negative for fatigue.       Wears seat belt  HENT: Negative for dental problem, facial swelling and tinnitus.        Sees dentist  Eyes: Negative for visual disturbance.       No diplopia or unilateral vision loss  Respiratory: Negative for cough, chest tightness and shortness of breath.   Cardiovascular: Negative for chest pain, palpitations and leg swelling.  Gastrointestinal: Negative for nausea, vomiting, abdominal pain, constipation and blood in stool.       No heartburn  Endocrine: Negative for polydipsia and polyuria.  Genitourinary: Negative for urgency, frequency and difficulty urinating.       No sexual problems  Musculoskeletal: Negative for back pain, joint swelling and arthralgias.  Skin: Negative for rash.  Allergic/Immunologic: Positive for environmental allergies. Negative for immunocompromised state.  Neurological: Negative for dizziness, syncope, weakness, light-headedness, numbness and headaches.  Hematological:  Negative for adenopathy. Does not bruise/bleed easily.  Psychiatric/Behavioral: Negative for sleep disturbance and dysphoric mood. The patient is not nervous/anxious.        Objective:   Physical Exam  Constitutional: He is oriented to person, place, and time. He appears well-developed and well-nourished. No distress.  HENT:  Head: Normocephalic and atraumatic.  Right Ear: External ear normal.  Left Ear: External ear normal.  Mouth/Throat: Oropharynx is clear and moist. No oropharyngeal exudate.  Eyes: Conjunctivae and EOM are normal. Pupils are equal, round, and reactive to light.  Neck: Normal range of motion. Neck supple. No  thyromegaly present.  Cardiovascular: Normal rate, regular rhythm, normal heart sounds and intact distal pulses.  Exam reveals no gallop.   No murmur heard. Pulmonary/Chest: Effort normal and breath sounds normal. No respiratory distress. He has no wheezes. He has no rales.  Abdominal: Soft. There is no tenderness.  Musculoskeletal: He exhibits no edema or tenderness.  Lymphadenopathy:    He has no cervical adenopathy.  Neurological: He is alert and oriented to person, place, and time.  Normal sensation on plantar feet  Skin: No rash noted. No erythema.  Psychiatric: He has a normal mood and affect. His behavior is normal.          Assessment & Plan:

## 2014-12-22 NOTE — Progress Notes (Signed)
Pre visit review using our clinic review tool, if applicable. No additional management support is needed unless otherwise documented below in the visit note. 

## 2014-12-22 NOTE — Assessment & Plan Note (Signed)
Healthy but let himself go Discussed fitness Defer PSA this year Colonoscopy due 2020--had small tubular adenoma last year

## 2014-12-22 NOTE — Assessment & Plan Note (Signed)
Has not been careful Discussed getting back with proper lifestyle Increase metformin if over 8%

## 2014-12-27 ENCOUNTER — Encounter: Payer: Self-pay | Admitting: *Deleted

## 2014-12-27 ENCOUNTER — Other Ambulatory Visit: Payer: Self-pay | Admitting: *Deleted

## 2014-12-27 MED ORDER — METFORMIN HCL ER 750 MG PO TB24: 750.0000 mg | ORAL_TABLET | Freq: Three times a day (TID) | ORAL | Status: DC

## 2014-12-27 NOTE — Telephone Encounter (Signed)
See lab results note rx sent to pharmacy by e-script

## 2014-12-29 ENCOUNTER — Other Ambulatory Visit: Payer: Self-pay | Admitting: Internal Medicine

## 2015-01-22 NOTE — Op Note (Signed)
PATIENT NAME:  Derrick Kennedy, Derrick Kennedy MR#:  [id number] DATE OF BIRTH:  September 15, 1961  DATE OF PROCEDURE:  11/24/2013  PREOPERATIVE DIAGNOSIS: Bilateral carpal tunnel syndrome.   POSTOPERATIVE DIAGNOSIS: Bilateral carpal tunnel syndrome.   PROCEDURES: 1.  Right carpal tunnel release.  2.  Left carpal tunnel release.   SURGEON: Christophe Louis, M.D.   ANESTHESIA: General.   COMPLICATIONS: None.   TOURNIQUET TIME: 14 minutes on the right and 12 minutes on the left.   PROCEDURE: After adequate induction of general anesthesia, both upper extremities were thoroughly prepped with alcohol and ChloraPrep and draped in standard sterile fashion. Identical procedures are performed on each side. The extremity is wrapped out with the Esmarch bandage and pneumatic tourniquet is elevated on the right to 250 mmHg.  On the left, an Esmarch bandage was used as a tourniquet about the forearm because of an intravenous line in the antecubital fossa. The standard volar carpal tunnel incision is then made under loupe magnification and the dissection carried down to the transverse retinacular ligament. This is incised in the midportion. The distal release is performed with the small scissors. The proximal release is performed with the small scissors and the carpal tunnel scissors. On each side there is seen to be moderate compression of the nerve directly beneath the ligament. There is minimal synovitis present no mass lesion is seen. Careful check is made both proximally and distally to ensure that complete release had been obtained. The wounds are thoroughly irrigated multiple times. Skin edges are infiltrated with 0.5% plain Marcaine. The skin is closed with 4-0 nylon. A soft bulky dressing is applied. Tourniquets are released. The patient is returned to the recovery room in satisfactory condition having tolerated the procedure quite well.     ____________________________ Lucas Mallow, MD ces:dp D: 11/25/2013  06:44:12 ET T: 11/25/2013 07:37:18 ET JOB#: [id number]  cc: Lucas Mallow, MD, <Dictator> Lucas Mallow MD ELECTRONICALLY SIGNED 11/25/2013 8:20

## 2015-05-31 ENCOUNTER — Ambulatory Visit (INDEPENDENT_AMBULATORY_CARE_PROVIDER_SITE_OTHER): Payer: BLUE CROSS/BLUE SHIELD | Admitting: Internal Medicine

## 2015-05-31 ENCOUNTER — Encounter: Payer: Self-pay | Admitting: Internal Medicine

## 2015-05-31 ENCOUNTER — Encounter: Payer: Self-pay | Admitting: *Deleted

## 2015-05-31 VITALS — BP 120/70 | HR 104 | Temp 98.1°F | Wt 286.0 lb

## 2015-05-31 DIAGNOSIS — IMO0001 Reserved for inherently not codable concepts without codable children: Secondary | ICD-10-CM

## 2015-05-31 DIAGNOSIS — E1165 Type 2 diabetes mellitus with hyperglycemia: Secondary | ICD-10-CM | POA: Diagnosis not present

## 2015-05-31 DIAGNOSIS — J209 Acute bronchitis, unspecified: Secondary | ICD-10-CM | POA: Diagnosis not present

## 2015-05-31 LAB — HEMOGLOBIN A1C: Hgb A1c MFr Bld: 7.8 % — ABNORMAL HIGH (ref 4.6–6.5)

## 2015-05-31 MED ORDER — HYDROCODONE-HOMATROPINE 5-1.5 MG/5ML PO SYRP: 5.0000 mL | ORAL_SOLUTION | Freq: Every evening | ORAL | Status: DC | PRN

## 2015-05-31 MED ORDER — DOXYCYCLINE HYCLATE 100 MG PO TABS: 100.0000 mg | ORAL_TABLET | Freq: Two times a day (BID) | ORAL | Status: DC

## 2015-05-31 NOTE — Progress Notes (Signed)
   Subjective:    Patient ID: Derrick Kennedy, male    DOB: 1960-11-27, 54 y.o.   MRN: [id number]  HPI Here due to cough---and will review his diabetes  Coughing for a week Feels it in his throat Cough coming from his chest AM sputum--dark orange, but then more green later. Clear in PM Better if he avoids drinking liquids No fever Some SOB if active  No head congestion No nasal drainage No sore throat  No exposure to dust but was working in [place]--- more air pollution  Benzonatate no help nyquil also not helping  Has been checking sugars intermittently AM usually 120-140 PM more variable at 140-180 Has been more careful with his eating Only taking the metformin twice a day  Current Outpatient Prescriptions on File Prior to Visit  Medication Sig Dispense Refill  . glipiZIDE (GLUCOTROL) 5 MG tablet TAKE 1 TABLET BY MOUTH EVERY DAY BEFORE BREAKFAST 60 tablet 11  . loratadine (CLARITIN) 10 MG tablet Take 10 mg by mouth as needed for allergies.    . metFORMIN (GLUCOPHAGE-XR) 750 MG 24 hr tablet Take 1 tablet (750 mg total) by mouth 3 (three) times daily. 90 tablet 3   No current facility-administered medications on file prior to visit.    No Known Allergies  Past Medical History  Diagnosis Date  . Bilateral carpal tunnel syndrome   . Type II or unspecified type diabetes mellitus without mention of complication, not stated as uncontrolled   . Allergic rhinitis due to pollen     Past Surgical History  Procedure Laterality Date  . Appendectomy  1976  . Foot surgery  2004    right heel reconstruction  . Carpal tunnel release Bilateral 11/24/13    Dr Margaretmary Eddy    Family History  Problem Relation Age of Onset  . Diabetes Brother   . Cancer Neg Hx   . Colon cancer Neg Hx     Social History   Social History  . Marital Status: Married    Spouse Name: N/A  . Number of Children: 3  . Years of Education: N/A   Occupational History  . owns company  Theatre manager    Social History Main Topics  . Smoking status: Never Smoker   . Smokeless tobacco: Never Used  . Alcohol Use: No  . Drug Use: No  . Sexual Activity: Yes   Other Topics Concern  . Not on file   Social History Narrative   Review of Systems Weight back down again More healthy eating No set exercise--- physically active at work though. Cutting, welding, fitting pipes, etc    Objective:   Physical Exam  Constitutional: He appears well-developed and well-nourished. No distress.  Frequent coarse cough  HENT:  Nose: Nose normal.  Mouth/Throat: Oropharynx is clear and moist. No oropharyngeal exudate.  No sinus tenderness TMs normal  Neck: Normal range of motion.  Pulmonary/Chest: Effort normal. No respiratory distress. He has no rales.  Not tight but has mild expiratory wheeze  Lymphadenopathy:    He has no cervical adenopathy.          Assessment & Plan:

## 2015-05-31 NOTE — Assessment & Plan Note (Signed)
Seems to be better with improved eating and losing the weight again Only taking 2 metformin instead of 3--urged him to increase the dose Will check A1c now

## 2015-05-31 NOTE — Patient Instructions (Signed)
Please increase the metformin to 3 tabs daily. Let me know if your cough isn't better by Thursday-- I will prescribe a short course of prednisone.

## 2015-05-31 NOTE — Assessment & Plan Note (Signed)
Some wheeze Will treat with doxy Cough syrup for bedtime If cough persists, would try brief prednisone burst

## 2015-05-31 NOTE — Progress Notes (Signed)
Pre visit review using our clinic review tool, if applicable. No additional management support is needed unless otherwise documented below in the visit note. 

## 2015-06-24 ENCOUNTER — Ambulatory Visit: Payer: BLUE CROSS/BLUE SHIELD | Admitting: Internal Medicine

## 2015-07-04 ENCOUNTER — Other Ambulatory Visit: Payer: Self-pay | Admitting: Internal Medicine

## 2015-08-02 ENCOUNTER — Other Ambulatory Visit: Payer: Self-pay | Admitting: Internal Medicine

## 2015-10-04 ENCOUNTER — Other Ambulatory Visit: Payer: Self-pay | Admitting: Internal Medicine

## 2015-11-03 ENCOUNTER — Other Ambulatory Visit: Payer: Self-pay | Admitting: Internal Medicine

## 2015-12-03 ENCOUNTER — Other Ambulatory Visit: Payer: Self-pay | Admitting: Internal Medicine

## 2015-12-05 NOTE — Telephone Encounter (Signed)
Rx sent electronically.  

## 2015-12-30 ENCOUNTER — Ambulatory Visit: Payer: BLUE CROSS/BLUE SHIELD | Admitting: Internal Medicine

## 2016-01-03 ENCOUNTER — Other Ambulatory Visit: Payer: Self-pay | Admitting: Internal Medicine

## 2016-01-31 ENCOUNTER — Other Ambulatory Visit: Payer: Self-pay | Admitting: Internal Medicine

## 2016-02-03 ENCOUNTER — Other Ambulatory Visit: Payer: Self-pay | Admitting: Internal Medicine

## 2016-02-03 DIAGNOSIS — M9903 Segmental and somatic dysfunction of lumbar region: Secondary | ICD-10-CM | POA: Diagnosis not present

## 2016-02-03 DIAGNOSIS — M5441 Lumbago with sciatica, right side: Secondary | ICD-10-CM | POA: Diagnosis not present

## 2016-02-27 ENCOUNTER — Other Ambulatory Visit: Payer: Self-pay | Admitting: Internal Medicine

## 2016-03-02 ENCOUNTER — Ambulatory Visit: Payer: BLUE CROSS/BLUE SHIELD | Admitting: Internal Medicine

## 2016-03-02 DIAGNOSIS — Z0289 Encounter for other administrative examinations: Secondary | ICD-10-CM

## 2016-03-05 ENCOUNTER — Telehealth: Payer: Self-pay | Admitting: Internal Medicine

## 2016-03-05 NOTE — Telephone Encounter (Signed)
Patient did not come for their scheduled appointment 6/2 for 6 month follow up/rbh   Please let me know if the patient needs to be contacted immediately for follow up or if no follow up is necessary.

## 2016-03-05 NOTE — Telephone Encounter (Signed)
He is actually due for PE See if you can get him in within the next few weeks--or at least at a follow up

## 2016-03-06 NOTE — Telephone Encounter (Signed)
cpx 7/3 Pt aware

## 2016-03-06 NOTE — Telephone Encounter (Signed)
Left message asking pt to call office  °

## 2016-04-02 ENCOUNTER — Encounter: Payer: BLUE CROSS/BLUE SHIELD | Admitting: Internal Medicine

## 2016-04-07 ENCOUNTER — Other Ambulatory Visit: Payer: Self-pay | Admitting: Internal Medicine

## 2016-04-09 NOTE — Telephone Encounter (Signed)
On 03-05-16, You advised that the pt needed his CPE on 04-02-16. He cancelled that on 03-27-16 and rescheduled it to 10-05-16. His last A1C was 7.8 on 05-31-15. He has cancelled and no showed within the year, also. Do you want to give him a 30 day supply and make an OV for DM?

## 2016-04-09 NOTE — Telephone Encounter (Signed)
Yes, I cannot wait that long to recheck his diabetic control

## 2016-04-17 ENCOUNTER — Telehealth: Payer: Self-pay

## 2016-04-17 NOTE — Telephone Encounter (Signed)
Pt left v/m requesting cb about refill. 

## 2016-04-17 NOTE — Telephone Encounter (Signed)
Pt left vm

## 2016-04-18 MED ORDER — GLIPIZIDE 5 MG PO TABS: ORAL_TABLET | ORAL | Status: DC

## 2016-04-18 NOTE — Telephone Encounter (Signed)
Left message for pt to call back. Refilled med for 30 days. He needs to make an OV for Diabetes in the next 30 days.

## 2016-04-18 NOTE — Addendum Note (Signed)
Addended by: Pilar Grammes on: 04/18/2016 08:59 AM   Modules accepted: Orders

## 2016-04-27 DIAGNOSIS — M76821 Posterior tibial tendinitis, right leg: Secondary | ICD-10-CM | POA: Diagnosis not present

## 2016-05-21 ENCOUNTER — Other Ambulatory Visit: Payer: Self-pay | Admitting: Internal Medicine

## 2016-06-14 ENCOUNTER — Other Ambulatory Visit: Payer: Self-pay | Admitting: Internal Medicine

## 2016-07-23 ENCOUNTER — Encounter: Payer: Self-pay | Admitting: *Deleted

## 2016-07-30 ENCOUNTER — Encounter: Payer: Self-pay | Admitting: Internal Medicine

## 2016-08-28 ENCOUNTER — Other Ambulatory Visit: Payer: Self-pay | Admitting: Internal Medicine

## 2016-09-11 ENCOUNTER — Ambulatory Visit (INDEPENDENT_AMBULATORY_CARE_PROVIDER_SITE_OTHER): Payer: BLUE CROSS/BLUE SHIELD | Admitting: Primary Care

## 2016-09-11 ENCOUNTER — Encounter: Payer: Self-pay | Admitting: Primary Care

## 2016-09-11 VITALS — BP 130/82 | HR 83 | Temp 98.2°F | Resp 10 | Wt 293.0 lb

## 2016-09-11 DIAGNOSIS — J069 Acute upper respiratory infection, unspecified: Secondary | ICD-10-CM | POA: Diagnosis not present

## 2016-09-11 MED ORDER — BENZONATATE 200 MG PO CAPS: 200.0000 mg | ORAL_CAPSULE | Freq: Three times a day (TID) | ORAL | 0 refills | Status: DC | PRN

## 2016-09-11 MED ORDER — HYDROCODONE-HOMATROPINE 5-1.5 MG/5ML PO SYRP: 5.0000 mL | ORAL_SOLUTION | Freq: Every evening | ORAL | 0 refills | Status: DC | PRN

## 2016-09-11 NOTE — Progress Notes (Signed)
Pre visit review using our clinic review tool, if applicable. No additional management support is needed unless otherwise documented below in the visit note. 

## 2016-09-11 NOTE — Patient Instructions (Signed)
You may take Benzonatate capsules for day time cough. Take 1 capsule by mouth three times daily as needed for cough.  You may take the Hycodan cough suppressant at bedtime as needed for cough and rest. Caution this medication contains codeine and will make you feel drowsy.  Cough/Congestion: Try taking Mucinex DM. This will help loosen up the mucous in your chest. Ensure you take this medication with a full glass of water.  Please call us Friday if your symptoms progress.  It was a pleasure meeting you!

## 2016-09-11 NOTE — Progress Notes (Signed)
Subjective:    Patient ID: Derrick Kennedy, male    DOB: Feb 14, 1961, 55 y.o.   MRN: [id number]  HPI  Mr. Spannagel is a 55 year old male with a history of acute bronchitis, allergic rhinitis, type 2 diabetes who presents today with a chief complaint of cough. His cough has been present for the past 3 days. He also reports nasal congestion. He's taken sudafed and benzonatate capsules from an older prescription with temporary improvement. His cough is productive with green/orange/brown color. He has a history of bronchitis for the past 10 years and will typically start out this way. He is worried it will become worse.  Review of Systems  Constitutional: Negative for chills, fatigue and fever.  HENT: Positive for congestion and postnasal drip. Negative for ear pain, sinus pressure and sore throat.   Respiratory: Positive for cough. Negative for shortness of breath.   Cardiovascular: Negative for chest pain.       Past Medical History:  Diagnosis Date  . Allergic rhinitis due to pollen   . Bilateral carpal tunnel syndrome   . Type II or unspecified type diabetes mellitus without mention of complication, not stated as uncontrolled      Social History   Social History  . Marital status: Married    Spouse name: N/A  . Number of children: 3  . Years of education: N/A   Occupational History  . owns company Theatre manager    Social History Main Topics  . Smoking status: Never Smoker  . Smokeless tobacco: Never Used  . Alcohol use No  . Drug use: No  . Sexual activity: Yes   Other Topics Concern  . Not on file   Social History Narrative  . No narrative on file    Past Surgical History:  Procedure Laterality Date  . APPENDECTOMY  1976  . CARPAL TUNNEL RELEASE Bilateral 11/24/13   Dr Margaretmary Eddy  . FOOT SURGERY  2004   right heel reconstruction    Family History  Problem Relation Age of Onset  . Diabetes Brother   . Cancer Neg Hx   . Colon cancer Neg Hx      No Known Allergies  Current Outpatient Prescriptions on File Prior to Visit  Medication Sig Dispense Refill  . glipiZIDE (GLUCOTROL) 5 MG tablet TAKE 1 TABLET BY MOUTH EVERY DAY BEFORE BREAKFAST 60 tablet 3  . loratadine (CLARITIN) 10 MG tablet Take 10 mg by mouth as needed for allergies.    . metFORMIN (GLUCOPHAGE-XR) 750 MG 24 hr tablet TAKE 1 TABLET(750 MG) BY MOUTH THREE TIMES DAILY 90 tablet 0   No current facility-administered medications on file prior to visit.     BP 130/82 (BP Location: Left Arm, Patient Position: Sitting, Cuff Size: Large)   Pulse 83   Temp 98.2 F (36.8 C) (Oral)   Resp 10   Wt 293 lb (132.9 kg)   SpO2 98%   BMI 42.04 kg/m    Objective:   Physical Exam  Constitutional: He appears well-nourished. He does not appear ill.  HENT:  Right Ear: Tympanic membrane and ear canal normal.  Left Ear: Tympanic membrane and ear canal normal.  Nose: No mucosal edema. Right sinus exhibits no maxillary sinus tenderness and no frontal sinus tenderness. Left sinus exhibits no maxillary sinus tenderness and no frontal sinus tenderness.  Mouth/Throat: Oropharynx is clear and moist.  Eyes: Conjunctivae are normal.  Neck: Neck supple.  Cardiovascular: Normal rate and regular rhythm.  Pulmonary/Chest: Effort normal and breath sounds normal. He has no wheezes. He has no rales.  Skin: Skin is warm and dry.          Assessment & Plan:  URI:  Cough and congestion x 3 days. Some improvement with OTC treatment. Exam today with clear lungs, unremarkable vitals, does not appear ill. Suspect viral involvement and will treat with supportive measures. Rx for Tessalon Pearls sent to pharmacy. Discussed to continue Mucinex. Fluids, rest. Discussed he may feel worse before feeling better, but to call us Friday this week if he starts to feel worse.  Sheral Flow, NP

## 2016-09-27 ENCOUNTER — Other Ambulatory Visit: Payer: Self-pay | Admitting: Internal Medicine

## 2016-09-27 MED ORDER — METFORMIN HCL ER 750 MG PO TB24: ORAL_TABLET | ORAL | 0 refills | Status: DC

## 2016-09-27 NOTE — Addendum Note (Signed)
Addended by: Pilar Grammes on: 09/27/2016 08:26 AM   Modules accepted: Orders

## 2016-10-05 ENCOUNTER — Encounter: Payer: BLUE CROSS/BLUE SHIELD | Admitting: Internal Medicine

## 2016-10-16 ENCOUNTER — Encounter: Payer: Self-pay | Admitting: Internal Medicine

## 2016-10-16 ENCOUNTER — Ambulatory Visit (INDEPENDENT_AMBULATORY_CARE_PROVIDER_SITE_OTHER): Payer: BLUE CROSS/BLUE SHIELD | Admitting: Internal Medicine

## 2016-10-16 VITALS — BP 122/86 | HR 74 | Temp 97.8°F | Ht 69.0 in | Wt 292.0 lb

## 2016-10-16 DIAGNOSIS — Z23 Encounter for immunization: Secondary | ICD-10-CM

## 2016-10-16 DIAGNOSIS — E1165 Type 2 diabetes mellitus with hyperglycemia: Secondary | ICD-10-CM

## 2016-10-16 DIAGNOSIS — Z0000 Encounter for general adult medical examination without abnormal findings: Secondary | ICD-10-CM

## 2016-10-16 DIAGNOSIS — G453 Amaurosis fugax: Secondary | ICD-10-CM

## 2016-10-16 DIAGNOSIS — Z125 Encounter for screening for malignant neoplasm of prostate: Secondary | ICD-10-CM

## 2016-10-16 DIAGNOSIS — IMO0001 Reserved for inherently not codable concepts without codable children: Secondary | ICD-10-CM

## 2016-10-16 LAB — T4, FREE: FREE T4: 0.57 ng/dL — AB (ref 0.60–1.60)

## 2016-10-16 LAB — HM DIABETES FOOT EXAM

## 2016-10-16 LAB — CBC WITH DIFFERENTIAL/PLATELET
Basophils Absolute: 0 10*3/uL (ref 0.0–0.1)
Basophils Relative: 0.4 % (ref 0.0–3.0)
EOS PCT: 1.9 % (ref 0.0–5.0)
Eosinophils Absolute: 0.1 10*3/uL (ref 0.0–0.7)
HCT: 39.5 % (ref 39.0–52.0)
Hemoglobin: 13.3 g/dL (ref 13.0–17.0)
LYMPHS ABS: 2 10*3/uL (ref 0.7–4.0)
Lymphocytes Relative: 34.8 % (ref 12.0–46.0)
MCHC: 33.8 g/dL (ref 30.0–36.0)
MCV: 91.1 fl (ref 78.0–100.0)
MONOS PCT: 8.1 % (ref 3.0–12.0)
Monocytes Absolute: 0.5 10*3/uL (ref 0.1–1.0)
NEUTROS ABS: 3.2 10*3/uL (ref 1.4–7.7)
NEUTROS PCT: 54.8 % (ref 43.0–77.0)
PLATELETS: 321 10*3/uL (ref 150.0–400.0)
RBC: 4.34 Mil/uL (ref 4.22–5.81)
RDW: 14.1 % (ref 11.5–15.5)
WBC: 5.8 10*3/uL (ref 4.0–10.5)

## 2016-10-16 LAB — PSA: PSA: 0.68 ng/mL (ref 0.10–4.00)

## 2016-10-16 LAB — LIPID PANEL
CHOL/HDL RATIO: 4
Cholesterol: 158 mg/dL (ref 0–200)
HDL: 43.1 mg/dL (ref >39.00)
LDL Cholesterol: 97 mg/dL (ref 0–99)
NONHDL: 114.6
Triglycerides: 90 mg/dL (ref 0.0–149.0)
VLDL: 18 mg/dL (ref 0.0–40.0)

## 2016-10-16 LAB — COMPREHENSIVE METABOLIC PANEL
ALK PHOS: 31 U/L — AB (ref 39–117)
ALT: 62 U/L — AB (ref 0–53)
AST: 40 U/L — ABNORMAL HIGH (ref 0–37)
Albumin: 4.1 g/dL (ref 3.5–5.2)
BILIRUBIN TOTAL: 0.4 mg/dL (ref 0.2–1.2)
BUN: 12 mg/dL (ref 6–23)
CO2: 28 meq/L (ref 19–32)
Calcium: 9.5 mg/dL (ref 8.4–10.5)
Chloride: 103 mEq/L (ref 96–112)
Creatinine, Ser: 0.67 mg/dL (ref 0.40–1.50)
GFR: 130.97 mL/min (ref >60.00)
GLUCOSE: 147 mg/dL — AB (ref 70–99)
POTASSIUM: 4.6 meq/L (ref 3.5–5.1)
SODIUM: 138 meq/L (ref 135–145)
TOTAL PROTEIN: 7.2 g/dL (ref 6.0–8.3)

## 2016-10-16 LAB — MICROALBUMIN / CREATININE URINE RATIO
CREATININE, U: 131.6 mg/dL
MICROALB UR: 1.3 mg/dL (ref 0.0–1.9)
MICROALB/CREAT RATIO: 1 mg/g (ref 0.0–30.0)

## 2016-10-16 LAB — HEMOGLOBIN A1C: HEMOGLOBIN A1C: 7.9 % — AB (ref 4.6–6.5)

## 2016-10-16 NOTE — Patient Instructions (Addendum)
Please set up your diabetic eye exam.  DASH Eating Plan DASH stands for "Dietary Approaches to Stop Hypertension." The DASH eating plan is a healthy eating plan that has been shown to reduce high blood pressure (hypertension). Additional health benefits may include reducing the risk of type 2 diabetes mellitus, heart disease, and stroke. The DASH eating plan may also help with weight loss. What do I need to know about the DASH eating plan? For the DASH eating plan, you will follow these general guidelines:  Choose foods with less than 150 milligrams of sodium per serving (as listed on the food label).  Use salt-free seasonings or herbs instead of table salt or sea salt.  Check with your health care provider or pharmacist before using salt substitutes.  Eat lower-sodium products. These are often labeled as "low-sodium" or "no salt added."  Eat fresh foods. Avoid eating a lot of canned foods.  Eat more vegetables, fruits, and low-fat dairy products.  Choose whole grains. Look for the word "whole" as the first word in the ingredient list.  Choose fish and skinless chicken or [place] more often than red meat. Limit fish, poultry, and meat to 6 oz (170 g) each day.  Limit sweets, desserts, sugars, and sugary drinks.  Choose heart-healthy fats.  Eat more home-cooked food and less restaurant, buffet, and fast food.  Limit fried foods.  Do not fry foods. Cook foods using methods such as baking, boiling, grilling, and broiling instead.  When eating at a restaurant, ask that your food be prepared with less salt, or no salt if possible. What foods can I eat? Seek help from a dietitian for individual calorie needs. Grains  Whole grain or whole wheat bread. Brown rice. Whole grain or whole wheat pasta. Quinoa, bulgur, and whole grain cereals. Low-sodium cereals. Corn or whole wheat flour tortillas. Whole grain cornbread. Whole grain crackers. Low-sodium crackers. Vegetables  Fresh or frozen  vegetables (raw, steamed, roasted, or grilled). Low-sodium or reduced-sodium tomato and vegetable juices. Low-sodium or reduced-sodium tomato sauce and paste. Low-sodium or reduced-sodium canned vegetables. Fruits  All fresh, canned (in natural juice), or frozen fruits. Meat and Other Protein Products  Ground beef (85% or leaner), grass-fed beef, or beef trimmed of fat. Skinless chicken or [place]. Ground chicken or [place]. Pork trimmed of fat. All fish and seafood. Eggs. Dried beans, peas, or lentils. Unsalted nuts and seeds. Unsalted canned beans. Dairy  Low-fat dairy products, such as skim or 1% milk, 2% or reduced-fat cheeses, low-fat ricotta or cottage cheese, or plain low-fat yogurt. Low-sodium or reduced-sodium cheeses. Fats and Oils  Tub margarines without trans fats. Light or reduced-fat mayonnaise and salad dressings (reduced sodium). Avocado. Safflower, olive, or canola oils. Natural peanut or almond butter. Other  Unsalted popcorn and pretzels. The items listed above may not be a complete list of recommended foods or beverages. Contact your dietitian for more options.  What foods are not recommended? Grains  White bread. White pasta. White rice. Refined cornbread. Bagels and croissants. Crackers that contain trans fat. Vegetables  Creamed or fried vegetables. Vegetables in a cheese sauce. Regular canned vegetables. Regular canned tomato sauce and paste. Regular tomato and vegetable juices. Fruits  Canned fruit in light or heavy syrup. Fruit juice. Meat and Other Protein Products  Fatty cuts of meat. Ribs, chicken wings, bacon, sausage, bologna, salami, chitterlings, fatback, hot dogs, bratwurst, and packaged luncheon meats. Salted nuts and seeds. Canned beans with salt. Dairy  Whole or 2% milk, cream, half-and-half, and  cream cheese. Whole-fat or sweetened yogurt. Full-fat cheeses or blue cheese. Nondairy creamers and whipped toppings. Processed cheese, cheese spreads, or cheese  curds. Condiments  Onion and garlic salt, seasoned salt, table salt, and sea salt. Canned and packaged gravies. Worcestershire sauce. Tartar sauce. Barbecue sauce. Teriyaki sauce. Soy sauce, including reduced sodium. Steak sauce. Fish sauce. Oyster sauce. Cocktail sauce. Horseradish. Ketchup and mustard. Meat flavorings and tenderizers. Bouillon cubes. Hot sauce. Tabasco sauce. Marinades. Taco seasonings. Relishes. Fats and Oils  Butter, stick margarine, lard, shortening, ghee, and bacon fat. Coconut, palm kernel, or palm oils. Regular salad dressings. Other  Pickles and olives. Salted popcorn and pretzels. The items listed above may not be a complete list of foods and beverages to avoid. Contact your dietitian for more information.  Where can I find more information? National Heart, Lung, and Blood Institute: travelstabloid.com This information is not intended to replace advice given to you by your health care provider. Make sure you discuss any questions you have with your health care provider. Document Released: 09/06/2011 Document Revised: 02/23/2016 Document Reviewed: 07/22/2013 Elsevier Interactive Patient Education  2017 [employer].

## 2016-10-16 NOTE — Addendum Note (Signed)
Addended by: Pilar Grammes on: 10/16/2016 11:26 AM   Modules accepted: Orders

## 2016-10-16 NOTE — Progress Notes (Signed)
Pre visit review using our clinic review tool, if applicable. No additional management support is needed unless otherwise documented below in the visit note. 

## 2016-10-16 NOTE — Assessment & Plan Note (Signed)
If over 8%--will add liraglutide or similar

## 2016-10-16 NOTE — Assessment & Plan Note (Signed)
Seems generally healthy but needs to work on fitness and reduce stress Prevnar today--prefers no flu vaccine Will check PSA Colon due 2020

## 2016-10-16 NOTE — Assessment & Plan Note (Signed)
Right eye No carotid bruit 4 months ago Will check carotids still If abnormal, needs statin and asa

## 2016-10-16 NOTE — Progress Notes (Signed)
Subjective:    Patient ID: Derrick Kennedy, male    DOB: 12-02-1960, 56 y.o.   MRN: [id number]  HPI Here for physical and follow up of diabetes  Doesn't check his sugars Due for eye exam No foot pain, sores or numbness. 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 glipiZIDE (GLUCOTROL) 5 MG tablet TAKE 1 TABLET BY MOUTH EVERY DAY BEFORE BREAKFAST 60 tablet 3  . loratadine (CLARITIN) 10 MG tablet Take 10 mg by mouth as needed for allergies.    . metFORMIN (GLUCOPHAGE-XR) 750 MG 24 hr tablet TAKE 1 TABLET(750 MG) BY MOUTH THREE TIMES DAILY 90 tablet 0   No current facility-administered medications on file prior to visit.     No Known Allergies  Past Medical History:  Diagnosis Date  . Allergic rhinitis due to pollen   . Bilateral carpal tunnel syndrome   . Type II or unspecified type diabetes mellitus without mention of complication, not stated as uncontrolled     Past Surgical History:  Procedure Laterality Date  . APPENDECTOMY  1976  . CARPAL TUNNEL RELEASE Bilateral 11/24/13   Dr Margaretmary Eddy  . FOOT SURGERY  2004   right heel reconstruction    Family History  Problem Relation Age of Onset  . Diabetes Brother   . Cancer Neg Hx   . Colon cancer Neg Hx     Social History   Social History  . Marital status: Married    Spouse name: N/A  . Number of children: 3  . Years of education: N/A   Occupational History  . owns company Theatre manager    Social History Main Topics  . Smoking  status: Never Smoker  . Smokeless tobacco: Never Used  . Alcohol use No  . Drug use: No  . Sexual activity: Yes   Other Topics Concern  . Not on file   Social History Narrative  . No narrative on file   Review of Systems  Constitutional: Positive for fatigue. Negative for unexpected weight change.       Working a lot--- business growing and hard to find people Not really exercising Wears seat belt  HENT: Negative for dental problem, hearing loss and tinnitus.        Keeps up with dentist--may need root canal but no pain  Eyes: Positive for visual disturbance.  Respiratory: Positive for cough. Negative for chest tightness, shortness of breath and wheezing.        Coughs with change in temperature or dust irratation  Cardiovascular: Negative for chest pain, palpitations and leg swelling.  Gastrointestinal: Negative for abdominal pain, constipation, nausea and vomiting.       No heartburn  Endocrine: Negative for polydipsia and polyuria.  Genitourinary: Positive for difficulty urinating and urgency. Negative for dysuria.  Musculoskeletal: Positive for neck stiffness. Negative for arthralgias, back pain and joint swelling.       Some tension and tightness in posterior neck--better if he stretches it out  Skin: Negative for rash.  No suspicious lesions  Allergic/Immunologic: Positive for environmental allergies. Negative for immunocompromised state.       Dust--uses the loratadine  Neurological: Negative for dizziness, syncope, weakness and headaches.       Some numbness in left lateral thigh at work--discussed meralgia paresthetica  Hematological: Negative for adenopathy. Does not bruise/bleed easily.  Psychiatric/Behavioral: Negative for dysphoric mood and sleep disturbance. The patient is not nervous/anxious.        Objective:   Physical Exam  Constitutional: He is oriented to person, place, and time. He appears well-developed and well-nourished. No distress.  HENT:    Head: Normocephalic and atraumatic.  Right Ear: External ear normal.  Left Ear: External ear normal.  Mouth/Throat: Oropharynx is clear and moist. No oropharyngeal exudate.  Eyes: Conjunctivae are normal. Pupils are equal, round, and reactive to light.  Neck: Normal range of motion. Neck supple. No thyromegaly present.  Cardiovascular: Normal rate, regular rhythm, normal heart sounds and intact distal pulses.  Exam reveals no gallop.   No murmur heard. Pulmonary/Chest: Effort normal and breath sounds normal. No respiratory distress. He has no wheezes. He has no rales.  Abdominal: Soft. There is no tenderness.  Musculoskeletal: He exhibits no edema or tenderness.  Lymphadenopathy:    He has no cervical adenopathy.  Neurological: He is alert and oriented to person, place, and time.  Normal sensation in plantar feet  Skin: No rash noted. No erythema.  No foot lesions  Psychiatric: He has a normal mood and affect.          Assessment & Plan:

## 2016-10-30 ENCOUNTER — Other Ambulatory Visit: Payer: Self-pay | Admitting: Internal Medicine

## 2016-11-14 ENCOUNTER — Ambulatory Visit: Payer: BLUE CROSS/BLUE SHIELD

## 2016-11-14 DIAGNOSIS — G453 Amaurosis fugax: Secondary | ICD-10-CM | POA: Diagnosis not present

## 2016-11-15 LAB — VAS US CAROTID
LCCADDIAS: -32 cm/s
LEFT ECA DIAS: -26 cm/s
LEFT VERTEBRAL DIAS: -16 cm/s
LICADSYS: -93 cm/s
LICAPSYS: 115 cm/s
Left CCA dist sys: -118 cm/s
Left CCA prox dias: 28 cm/s
Left CCA prox sys: 155 cm/s
Left ICA dist dias: -33 cm/s
Left ICA prox dias: 23 cm/s
RCCADSYS: -110 cm/s
RIGHT ECA DIAS: -18 cm/s
RIGHT VERTEBRAL DIAS: -18 cm/s
Right CCA prox dias: -26 cm/s
Right CCA prox sys: -133 cm/s

## 2016-11-27 DIAGNOSIS — E089 Diabetes mellitus due to underlying condition without complications: Secondary | ICD-10-CM | POA: Diagnosis not present

## 2016-11-27 DIAGNOSIS — E119 Type 2 diabetes mellitus without complications: Secondary | ICD-10-CM | POA: Diagnosis not present

## 2016-11-28 LAB — HM DIABETES EYE EXAM

## 2016-12-03 ENCOUNTER — Encounter: Payer: Self-pay | Admitting: *Deleted

## 2017-01-28 ENCOUNTER — Other Ambulatory Visit: Payer: Self-pay | Admitting: Internal Medicine

## 2017-04-16 ENCOUNTER — Encounter: Payer: Self-pay | Admitting: Internal Medicine

## 2017-04-16 ENCOUNTER — Ambulatory Visit (INDEPENDENT_AMBULATORY_CARE_PROVIDER_SITE_OTHER): Payer: BLUE CROSS/BLUE SHIELD | Admitting: Internal Medicine

## 2017-04-16 VITALS — BP 138/86 | HR 77 | Temp 97.9°F | Wt 286.0 lb

## 2017-04-16 DIAGNOSIS — IMO0001 Reserved for inherently not codable concepts without codable children: Secondary | ICD-10-CM

## 2017-04-16 DIAGNOSIS — E1165 Type 2 diabetes mellitus with hyperglycemia: Secondary | ICD-10-CM

## 2017-04-16 DIAGNOSIS — R945 Abnormal results of liver function studies: Secondary | ICD-10-CM

## 2017-04-16 DIAGNOSIS — R7989 Other specified abnormal findings of blood chemistry: Secondary | ICD-10-CM | POA: Insufficient documentation

## 2017-04-16 LAB — HEPATIC FUNCTION PANEL
ALBUMIN: 4 g/dL (ref 3.5–5.2)
ALT: 41 U/L (ref 0–53)
AST: 27 U/L (ref 0–37)
Alkaline Phosphatase: 30 U/L — ABNORMAL LOW (ref 39–117)
Bilirubin, Direct: 0.1 mg/dL (ref 0.0–0.3)
TOTAL PROTEIN: 7.3 g/dL (ref 6.0–8.3)
Total Bilirubin: 0.3 mg/dL (ref 0.2–1.2)

## 2017-04-16 LAB — T4, FREE: FREE T4: 0.78 ng/dL (ref 0.60–1.60)

## 2017-04-16 LAB — HEMOGLOBIN A1C: Hgb A1c MFr Bld: 7.6 % — ABNORMAL HIGH (ref 4.6–6.5)

## 2017-04-16 NOTE — Assessment & Plan Note (Signed)
Has improved eating Hopefully will show in labs He would prefer pills to shots if he needs something more

## 2017-04-16 NOTE — Progress Notes (Signed)
   Subjective:    Patient ID: Derrick Kennedy, male    DOB: 1960/10/20, 56 y.o.   MRN: [id number]  HPI Here for follow up of diabetes and other medical conditions  Has been working on what he eats--has cut out a lot of carbs Stopped juice and soda Has lost a few pounds No hypoglycemic reactions Some AM itching in feet--no numbness or pain  No chest pain  No SOB  Current Outpatient Prescriptions on File Prior to Visit  Medication Sig Dispense Refill  . glipiZIDE (GLUCOTROL) 5 MG tablet TAKE 1 TABLET BY MOUTH EVERY DAY BEFORE BREAKFAST 180 tablet 0  . loratadine (CLARITIN) 10 MG tablet Take 10 mg by mouth as needed for allergies.    . metFORMIN (GLUCOPHAGE-XR) 750 MG 24 hr tablet TAKE 1 TABLET(750 MG) BY MOUTH THREE TIMES DAILY 270 tablet 3   No current facility-administered medications on file prior to visit.     No Known Allergies  Past Medical History:  Diagnosis Date  . Allergic rhinitis due to pollen   . Bilateral carpal tunnel syndrome   . Type II or unspecified type diabetes mellitus without mention of complication, not stated as uncontrolled     Past Surgical History:  Procedure Laterality Date  . APPENDECTOMY  1976  . CARPAL TUNNEL RELEASE Bilateral 11/24/13   Dr Margaretmary Eddy  . FOOT SURGERY  2004   right heel reconstruction    Family History  Problem Relation Age of Onset  . Diabetes Brother   . Cancer Neg Hx   . Colon cancer Neg Hx     Social History   Social History  . Marital status: Married    Spouse name: N/A  . Number of children: 3  . Years of education: N/A   Occupational History  . owns company Theatre manager    Social History Main Topics  . Smoking status: Never Smoker  . Smokeless tobacco: Never Used  . Alcohol use No  . Drug use: No  . Sexual activity: Yes   Other Topics Concern  . Not on file   Social History Narrative  . No narrative on file   Review of Systems Energy levels are good Sleep is  variable--depends on his work schedule    Objective:   Physical Exam  Constitutional: He appears well-nourished. No distress.  Cardiovascular: Normal rate, regular rhythm, normal heart sounds and intact distal pulses.  Exam reveals no gallop.   No murmur heard. Pulmonary/Chest: Effort normal and breath sounds normal. No respiratory distress. He has no wheezes. He has no rales.  Musculoskeletal: He exhibits no edema.  Skin:  Mycotic toenails Some excoriations on bottom of left foot--no ulcer          Assessment & Plan:

## 2017-04-16 NOTE — Assessment & Plan Note (Signed)
Likely mild fatty liver Will recheck labs

## 2017-04-17 ENCOUNTER — Encounter: Payer: Self-pay | Admitting: *Deleted

## 2017-04-17 LAB — HEPATITIS PANEL, ACUTE
HCV AB: NEGATIVE
HEP B S AG: NEGATIVE
Hep A IgM: NONREACTIVE
Hep B C IgM: NONREACTIVE

## 2017-06-05 DIAGNOSIS — M542 Cervicalgia: Secondary | ICD-10-CM | POA: Diagnosis not present

## 2017-06-05 DIAGNOSIS — M9902 Segmental and somatic dysfunction of thoracic region: Secondary | ICD-10-CM | POA: Diagnosis not present

## 2017-06-05 DIAGNOSIS — M9901 Segmental and somatic dysfunction of cervical region: Secondary | ICD-10-CM | POA: Diagnosis not present

## 2017-06-05 DIAGNOSIS — M546 Pain in thoracic spine: Secondary | ICD-10-CM | POA: Diagnosis not present

## 2017-06-07 DIAGNOSIS — M546 Pain in thoracic spine: Secondary | ICD-10-CM | POA: Diagnosis not present

## 2017-06-07 DIAGNOSIS — M9902 Segmental and somatic dysfunction of thoracic region: Secondary | ICD-10-CM | POA: Diagnosis not present

## 2017-06-07 DIAGNOSIS — M542 Cervicalgia: Secondary | ICD-10-CM | POA: Diagnosis not present

## 2017-06-07 DIAGNOSIS — M9901 Segmental and somatic dysfunction of cervical region: Secondary | ICD-10-CM | POA: Diagnosis not present

## 2017-06-10 DIAGNOSIS — M9902 Segmental and somatic dysfunction of thoracic region: Secondary | ICD-10-CM | POA: Diagnosis not present

## 2017-06-10 DIAGNOSIS — M546 Pain in thoracic spine: Secondary | ICD-10-CM | POA: Diagnosis not present

## 2017-06-10 DIAGNOSIS — M542 Cervicalgia: Secondary | ICD-10-CM | POA: Diagnosis not present

## 2017-06-10 DIAGNOSIS — M9901 Segmental and somatic dysfunction of cervical region: Secondary | ICD-10-CM | POA: Diagnosis not present

## 2017-06-17 DIAGNOSIS — M9901 Segmental and somatic dysfunction of cervical region: Secondary | ICD-10-CM | POA: Diagnosis not present

## 2017-06-17 DIAGNOSIS — M542 Cervicalgia: Secondary | ICD-10-CM | POA: Diagnosis not present

## 2017-06-17 DIAGNOSIS — M546 Pain in thoracic spine: Secondary | ICD-10-CM | POA: Diagnosis not present

## 2017-06-17 DIAGNOSIS — M9902 Segmental and somatic dysfunction of thoracic region: Secondary | ICD-10-CM | POA: Diagnosis not present

## 2017-06-20 DIAGNOSIS — M9901 Segmental and somatic dysfunction of cervical region: Secondary | ICD-10-CM | POA: Diagnosis not present

## 2017-06-20 DIAGNOSIS — M546 Pain in thoracic spine: Secondary | ICD-10-CM | POA: Diagnosis not present

## 2017-06-20 DIAGNOSIS — M9902 Segmental and somatic dysfunction of thoracic region: Secondary | ICD-10-CM | POA: Diagnosis not present

## 2017-06-20 DIAGNOSIS — M542 Cervicalgia: Secondary | ICD-10-CM | POA: Diagnosis not present

## 2017-06-24 DIAGNOSIS — M542 Cervicalgia: Secondary | ICD-10-CM | POA: Diagnosis not present

## 2017-06-24 DIAGNOSIS — M546 Pain in thoracic spine: Secondary | ICD-10-CM | POA: Diagnosis not present

## 2017-06-24 DIAGNOSIS — M9902 Segmental and somatic dysfunction of thoracic region: Secondary | ICD-10-CM | POA: Diagnosis not present

## 2017-06-24 DIAGNOSIS — M9901 Segmental and somatic dysfunction of cervical region: Secondary | ICD-10-CM | POA: Diagnosis not present

## 2017-06-27 DIAGNOSIS — M9901 Segmental and somatic dysfunction of cervical region: Secondary | ICD-10-CM | POA: Diagnosis not present

## 2017-06-27 DIAGNOSIS — M542 Cervicalgia: Secondary | ICD-10-CM | POA: Diagnosis not present

## 2017-06-27 DIAGNOSIS — M9902 Segmental and somatic dysfunction of thoracic region: Secondary | ICD-10-CM | POA: Diagnosis not present

## 2017-06-27 DIAGNOSIS — M546 Pain in thoracic spine: Secondary | ICD-10-CM | POA: Diagnosis not present

## 2017-08-19 ENCOUNTER — Other Ambulatory Visit: Payer: Self-pay | Admitting: Internal Medicine

## 2017-10-18 ENCOUNTER — Encounter: Payer: BLUE CROSS/BLUE SHIELD | Admitting: Internal Medicine

## 2017-11-14 ENCOUNTER — Other Ambulatory Visit: Payer: Self-pay | Admitting: Internal Medicine

## 2018-02-12 ENCOUNTER — Other Ambulatory Visit: Payer: Self-pay | Admitting: Internal Medicine

## 2018-02-27 ENCOUNTER — Telehealth: Payer: Self-pay | Admitting: Internal Medicine

## 2018-02-27 NOTE — Telephone Encounter (Signed)
Pt's wife called back and was informed of the message below, the pt states he is in and out of town until the end of August, pt will like to schedule a cpx w/ Dr. Silvio Pate pt wanted to know due to the pcp's slot availability if he can get this done in a 16min slot due to traveling for work, call pt/wife to advise

## 2018-02-27 NOTE — Telephone Encounter (Signed)
Thank you for your help.

## 2018-02-27 NOTE — Telephone Encounter (Signed)
Derrick Kennedy, will you let him know we can see him for 15 mins to f/u his Diabetes. A CPE is 30 mins. Thank you.

## 2018-02-27 NOTE — Telephone Encounter (Signed)
Patient didn't answer when I called him. I left a message on his voice mail with your message and I put in a CRM for [facility] to schedule either 15 minute appointment for diabetes f/u or 30 minutes for cpx.

## 2018-02-27 NOTE — Telephone Encounter (Signed)
Patient is completely out of the Metformin.

## 2018-02-27 NOTE — Telephone Encounter (Signed)
Unable to reach pt or his wife (DPR signed) to schedule a CPX for pt; 10/18/17 CPX appt cancelled by pt due to pt being out of town. Per note pt is out of metformin; last seen for diabetic f/u 04/16/17. Dr Silvio Pate not in office. Refilled metformin to [facility] with note for pt to call for CPX per protocol.

## 2018-04-01 DIAGNOSIS — S299XXA Unspecified injury of thorax, initial encounter: Secondary | ICD-10-CM | POA: Diagnosis not present

## 2018-04-02 DIAGNOSIS — S299XXA Unspecified injury of thorax, initial encounter: Secondary | ICD-10-CM | POA: Diagnosis not present

## 2018-05-11 ENCOUNTER — Other Ambulatory Visit: Payer: Self-pay | Admitting: Internal Medicine

## 2018-05-27 ENCOUNTER — Other Ambulatory Visit: Payer: Self-pay | Admitting: Internal Medicine

## 2018-06-22 ENCOUNTER — Other Ambulatory Visit: Payer: Self-pay | Admitting: Internal Medicine

## 2018-07-18 ENCOUNTER — Other Ambulatory Visit: Payer: Self-pay | Admitting: Internal Medicine

## 2018-08-05 ENCOUNTER — Other Ambulatory Visit: Payer: Self-pay | Admitting: Internal Medicine

## 2018-08-21 ENCOUNTER — Other Ambulatory Visit: Payer: Self-pay | Admitting: Internal Medicine

## 2018-09-07 ENCOUNTER — Other Ambulatory Visit: Payer: Self-pay | Admitting: Internal Medicine

## 2018-10-08 ENCOUNTER — Other Ambulatory Visit: Payer: Self-pay | Admitting: Internal Medicine

## 2018-10-14 ENCOUNTER — Telehealth: Payer: Self-pay | Admitting: Internal Medicine

## 2018-10-14 NOTE — Telephone Encounter (Signed)
Pt called office to get a refill on the Glipizide and the Metformin. I scheduled the pt for 02/05/19 for his physical. Pt wants to know if he can continue to get refills until then. Pt has been out of Metformin for about 3 weeks now.

## 2018-10-15 MED ORDER — GLIPIZIDE 5 MG PO TABS: 5.0000 mg | ORAL_TABLET | Freq: Every day | ORAL | 3 refills | Status: DC

## 2018-10-15 MED ORDER — METFORMIN HCL ER 750 MG PO TB24: ORAL_TABLET | ORAL | 3 refills | Status: DC

## 2018-10-15 NOTE — Telephone Encounter (Signed)
Rxs sent electronically. Spoke to pt

## 2018-11-28 ENCOUNTER — Encounter: Payer: Self-pay | Admitting: Internal Medicine

## 2019-01-27 DIAGNOSIS — M9903 Segmental and somatic dysfunction of lumbar region: Secondary | ICD-10-CM | POA: Diagnosis not present

## 2019-01-27 DIAGNOSIS — M9904 Segmental and somatic dysfunction of sacral region: Secondary | ICD-10-CM | POA: Diagnosis not present

## 2019-01-27 DIAGNOSIS — M545 Low back pain: Secondary | ICD-10-CM | POA: Diagnosis not present

## 2019-01-27 DIAGNOSIS — M461 Sacroiliitis, not elsewhere classified: Secondary | ICD-10-CM | POA: Diagnosis not present

## 2019-02-05 ENCOUNTER — Encounter: Payer: Self-pay | Admitting: Internal Medicine

## 2019-02-05 ENCOUNTER — Ambulatory Visit (INDEPENDENT_AMBULATORY_CARE_PROVIDER_SITE_OTHER): Payer: BLUE CROSS/BLUE SHIELD | Admitting: Internal Medicine

## 2019-02-05 VITALS — Temp 98.4°F | Ht 70.0 in | Wt 285.0 lb

## 2019-02-05 DIAGNOSIS — Z0000 Encounter for general adult medical examination without abnormal findings: Secondary | ICD-10-CM | POA: Diagnosis not present

## 2019-02-05 DIAGNOSIS — IMO0001 Reserved for inherently not codable concepts without codable children: Secondary | ICD-10-CM

## 2019-02-05 DIAGNOSIS — Z125 Encounter for screening for malignant neoplasm of prostate: Secondary | ICD-10-CM | POA: Diagnosis not present

## 2019-02-05 DIAGNOSIS — E1165 Type 2 diabetes mellitus with hyperglycemia: Secondary | ICD-10-CM

## 2019-02-05 NOTE — Assessment & Plan Note (Signed)
Discussed weight loss. 

## 2019-02-05 NOTE — Assessment & Plan Note (Signed)
Looks good Overdue for colonoscopy---he got letter but it wasn't set up Discussed PSA---he would like this Discussed lifestyle and weight loss Flu vaccine in the fall

## 2019-02-05 NOTE — Progress Notes (Signed)
Subjective:    Patient ID: Derrick Kennedy, male    DOB: 1961-03-24, 58 y.o.   MRN: [id number]  HPI Virtual visit for physical Hasn't been seen in a while Identification done Reviewed billing and he gave consent He is at home and I am in my office  Checking sugars 3-4 times per week Hasn't run out of medication Usually 165-205 Weight is up slightly--due to leg injury (hyperexended knee and COVID) Fairly good with his eating No foot pain or numbness  Current Outpatient Medications on File Prior to Visit  Medication Sig Dispense Refill  . glipiZIDE (GLUCOTROL) 5 MG tablet Take 1 tablet (5 mg total) by mouth daily before breakfast. 30 tablet 3  . loratadine (CLARITIN) 10 MG tablet Take 10 mg by mouth as needed for allergies.    . metFORMIN (GLUCOPHAGE-XR) 750 MG 24 hr tablet TAKE 1 TABLET BY MOUTH THREE TIMES DAILY 90 tablet 3   No current facility-administered medications on file prior to visit.     No Known Allergies  Past Medical History:  Diagnosis Date  . Allergic rhinitis due to pollen   . Bilateral carpal tunnel syndrome   . Type II or unspecified type diabetes mellitus without mention of complication, not stated as uncontrolled     Past Surgical History:  Procedure Laterality Date  . APPENDECTOMY  1976  . CARPAL TUNNEL RELEASE Bilateral 11/24/13   Dr Margaretmary Eddy  . FOOT SURGERY  2004   right heel reconstruction    Family History  Problem Relation Age of Onset  . Diabetes Brother   . Kidney failure Brother   . Cancer Neg Hx   . Colon cancer Neg Hx     Social History   Socioeconomic History  . Marital status: Married    Spouse name: Not on file  . Number of children: 3  . Years of education: Not on file  . Highest education level: Not on file  Occupational History  . Occupation: owns Automotive engineer  Social Needs  . Financial resource strain: Not on file  . Food insecurity:    Worry: Not on file    Inability: Not on file  .  Transportation needs:    Medical: Not on file    Non-medical: Not on file  Tobacco Use  . Smoking status: Never Smoker  . Smokeless tobacco: Never Used  Substance and Sexual Activity  . Alcohol use: No  . Drug use: No  . Sexual activity: Yes  Lifestyle  . Physical activity:    Days per week: Not on file    Minutes per session: Not on file  . Stress: Not on file  Relationships  . Social connections:    Talks on phone: Not on file    Gets together: Not on file    Attends religious service: Not on file    Active member of club or organization: Not on file    Attends meetings of clubs or organizations: Not on file    Relationship status: Not on file  . Intimate partner violence:    Fear of current or ex partner: Not on file    Emotionally abused: Not on file    Physically abused: Not on file    Forced sexual activity: Not on file  Other Topics Concern  . Not on file  Social History Narrative  . Not on file   Review of Systems  Constitutional: Negative for fatigue.       Weight  up slightly Wears seat belt  HENT: Negative for dental problem, hearing loss and tinnitus.        Overdue for the dentist  Eyes: Negative for visual disturbance.  Respiratory: Negative for chest tightness and shortness of breath.        Has occasional reflex cough--like with change in temperature  Cardiovascular: Negative for chest pain, palpitations and leg swelling.  Gastrointestinal: Negative for blood in stool and constipation.       No heartburn  Endocrine: Negative for polydipsia and polyuria.  Genitourinary: Negative for frequency and urgency.       Has nocturia unless he takes 2 advil at night No daytime problems  Musculoskeletal: Positive for arthralgias. Negative for joint swelling.  Skin: Negative for rash.       No suspicious lesions  Allergic/Immunologic: Positive for environmental allergies. Negative for immunocompromised state.  Neurological: Negative for dizziness, speech  difficulty, light-headedness and headaches.  Hematological: Negative for adenopathy. Does not bruise/bleed easily.  Psychiatric/Behavioral: Negative for dysphoric mood and sleep disturbance. The patient is not nervous/anxious.        Objective:   Physical Exam  Constitutional: He appears well-developed. No distress.  Respiratory: Effort normal. No respiratory distress.  Musculoskeletal:        General: No edema.  Skin:  No foot lesions  Psychiatric: He has a normal mood and affect. His behavior is normal.           Assessment & Plan:

## 2019-02-05 NOTE — Assessment & Plan Note (Signed)
Sugars are running fairly high--but typical for him Will have to check labs I would be inclined to start liraglutide if he would accept it

## 2019-02-06 ENCOUNTER — Telehealth: Payer: Self-pay | Admitting: Internal Medicine

## 2019-02-06 NOTE — Telephone Encounter (Signed)
I left a detailed message on patient's voice mail to call back and schedule lab work/urine test in the next week or so and 6 mth follow up with Dr. Silvio Pate.

## 2019-02-08 ENCOUNTER — Other Ambulatory Visit: Payer: Self-pay | Admitting: Internal Medicine

## 2019-02-09 ENCOUNTER — Other Ambulatory Visit: Payer: Self-pay | Admitting: Internal Medicine

## 2019-02-09 ENCOUNTER — Other Ambulatory Visit (INDEPENDENT_AMBULATORY_CARE_PROVIDER_SITE_OTHER): Payer: BLUE CROSS/BLUE SHIELD

## 2019-02-09 ENCOUNTER — Telehealth: Payer: Self-pay | Admitting: *Deleted

## 2019-02-09 DIAGNOSIS — E0865 Diabetes mellitus due to underlying condition with hyperglycemia: Secondary | ICD-10-CM

## 2019-02-09 DIAGNOSIS — IMO0001 Reserved for inherently not codable concepts without codable children: Secondary | ICD-10-CM

## 2019-02-09 DIAGNOSIS — Z125 Encounter for screening for malignant neoplasm of prostate: Secondary | ICD-10-CM | POA: Diagnosis not present

## 2019-02-09 DIAGNOSIS — E1165 Type 2 diabetes mellitus with hyperglycemia: Secondary | ICD-10-CM | POA: Diagnosis not present

## 2019-02-09 LAB — CBC
HCT: 39.6 % (ref 39.0–52.0)
Hemoglobin: 13.3 g/dL (ref 13.0–17.0)
MCHC: 33.6 g/dL (ref 30.0–36.0)
MCV: 92.8 fl (ref 78.0–100.0)
Platelets: 317 10*3/uL (ref 150.0–400.0)
RBC: 4.26 Mil/uL (ref 4.22–5.81)
RDW: 14 % (ref 11.5–15.5)
WBC: 5.7 10*3/uL (ref 4.0–10.5)

## 2019-02-09 LAB — COMPREHENSIVE METABOLIC PANEL
ALT: 65 U/L — ABNORMAL HIGH (ref 0–53)
AST: 40 U/L — ABNORMAL HIGH (ref 0–37)
Albumin: 4.1 g/dL (ref 3.5–5.2)
Alkaline Phosphatase: 34 U/L — ABNORMAL LOW (ref 39–117)
BUN: 15 mg/dL (ref 6–23)
CO2: 28 mEq/L (ref 19–32)
Calcium: 9.6 mg/dL (ref 8.4–10.5)
Chloride: 102 mEq/L (ref 96–112)
Creatinine, Ser: 0.64 mg/dL (ref 0.40–1.50)
GFR: 128.82 mL/min (ref >60.00)
Glucose, Bld: 208 mg/dL — ABNORMAL HIGH (ref 70–99)
Potassium: 4.6 mEq/L (ref 3.5–5.1)
Sodium: 138 mEq/L (ref 135–145)
Total Bilirubin: 0.3 mg/dL (ref 0.2–1.2)
Total Protein: 7.2 g/dL (ref 6.0–8.3)

## 2019-02-09 LAB — LIPID PANEL
Cholesterol: 152 mg/dL (ref 0–200)
HDL: 38 mg/dL — ABNORMAL LOW (ref >39.00)
LDL Cholesterol: 90 mg/dL (ref 0–99)
NonHDL: 114.07
Total CHOL/HDL Ratio: 4
Triglycerides: 118 mg/dL (ref 0.0–149.0)
VLDL: 23.6 mg/dL (ref 0.0–40.0)

## 2019-02-09 LAB — PSA: PSA: 0.54 ng/mL (ref 0.10–4.00)

## 2019-02-09 LAB — HEMOGLOBIN A1C: Hgb A1c MFr Bld: 9.6 % — ABNORMAL HIGH (ref 4.6–6.5)

## 2019-02-09 MED ORDER — LIRAGLUTIDE 18 MG/3ML ~~LOC~~ SOPN: 1.2000 mg | PEN_INJECTOR | Freq: Every day | SUBCUTANEOUS | 11 refills | Status: DC

## 2019-02-09 NOTE — Telephone Encounter (Signed)
Patient has been scheduled for colonoscopy on 03/24/19 at 8 am. He is also scheduled for previsit on 03/12/19 at 8 am. He verbalizes understanding.

## 2019-02-09 NOTE — Telephone Encounter (Signed)
-----   Message from Jerene Bears, MD sent at 02/09/2019  1:30 PM EDT ----- Will do and thanks for the message Willis Modena, Please contact the patient can arrange surveillance colon in the [facility] ----- Message ----- From: Venia Carbon, MD Sent: 02/05/2019   4:40 PM EDT To: Jerene Bears, MD  Ulice Dash, I know you sent him a reminder for his colon due in January and it didn't get set up (he is a bit passive aggressive with appts). Can you please try again? Thanks, Denice Paradise

## 2019-02-10 LAB — MICROALBUMIN / CREATININE URINE RATIO
Creatinine,U: 116.2 mg/dL
Microalb Creat Ratio: 1.1 mg/g (ref 0.0–30.0)
Microalb, Ur: 1.3 mg/dL (ref 0.0–1.9)

## 2019-03-12 ENCOUNTER — Other Ambulatory Visit: Payer: Self-pay

## 2019-03-12 ENCOUNTER — Ambulatory Visit: Payer: Self-pay | Admitting: *Deleted

## 2019-03-12 VITALS — Ht 69.0 in | Wt 285.0 lb

## 2019-03-12 DIAGNOSIS — Z86010 Personal history of colonic polyps: Secondary | ICD-10-CM

## 2019-03-12 MED ORDER — NA SULFATE-K SULFATE-MG SULF 17.5-3.13-1.6 GM/177ML PO SOLN: 1.0000 | Freq: Once | ORAL | 0 refills | Status: AC

## 2019-03-12 NOTE — Progress Notes (Signed)
Patient's pre-visit was done today over the phone with the patient due to Covid-19. Name,DOB and address verified. Insurance verified. Packet of Prep instructions mailed to patient including copy of a consent form and pre-procedure patient acknowledgement form- and Suprep coupon-pt is aware. Patient understands to call us back with any questions or concerns.   Patient denies any allergies to eggs or soy. Patient denies any problems with anesthesia/sedation. Patient denies any oxygen use at home. Patient denies taking any diet/weight loss medications or blood thinners. EMMI education assisgned to patient on colonoscopy, this was explained and instructions given to patient. 

## 2019-03-13 ENCOUNTER — Encounter: Payer: Self-pay | Admitting: Internal Medicine

## 2019-03-23 ENCOUNTER — Telehealth: Payer: Self-pay | Admitting: Internal Medicine

## 2019-03-23 NOTE — Telephone Encounter (Signed)
lmom for pt to cb and anwer covid questions

## 2019-03-24 ENCOUNTER — Other Ambulatory Visit: Payer: Self-pay

## 2019-03-24 ENCOUNTER — Encounter: Payer: Self-pay | Admitting: Internal Medicine

## 2019-03-24 ENCOUNTER — Ambulatory Visit (AMBULATORY_SURGERY_CENTER): Payer: BC Managed Care – PPO | Admitting: Internal Medicine

## 2019-03-24 VITALS — BP 177/95 | HR 71 | Temp 97.9°F | Resp 15 | Ht 69.0 in | Wt 285.0 lb

## 2019-03-24 DIAGNOSIS — D125 Benign neoplasm of sigmoid colon: Secondary | ICD-10-CM

## 2019-03-24 DIAGNOSIS — K635 Polyp of colon: Secondary | ICD-10-CM | POA: Diagnosis not present

## 2019-03-24 DIAGNOSIS — Z1211 Encounter for screening for malignant neoplasm of colon: Secondary | ICD-10-CM | POA: Diagnosis not present

## 2019-03-24 DIAGNOSIS — Z86010 Personal history of colonic polyps: Secondary | ICD-10-CM

## 2019-03-24 MED ORDER — SODIUM CHLORIDE 0.9 % IV SOLN: 500.0000 mL | Freq: Once | INTRAVENOUS | Status: DC

## 2019-03-24 NOTE — Op Note (Signed)
[facility] Patient Name: Derrick Kennedy Procedure Date: 03/24/2019 8:08 AM MRN: [id number] Endoscopist: Jerene Bears , MD Age: 58 Referring MD:  Date of Birth: 1961-09-29 Gender: Male Account #: [id number] Procedure:                Colonoscopy Indications:              Surveillance: Personal history of adenomatous                            polyps on last colonoscopy 5 years ago Medicines:                Monitored Anesthesia Care Procedure:                Pre-Anesthesia Assessment:                           - Prior to the procedure, a History and Physical                            was performed, and patient medications and                            allergies were reviewed. The patient's tolerance of                            previous anesthesia was also reviewed. The risks                            and benefits of the procedure and the sedation                            options and risks were discussed with the patient.                            All questions were answered, and informed consent                            was obtained. Prior Anticoagulants: The patient has                            taken no previous anticoagulant or antiplatelet                            agents. ASA Grade Assessment: III - A patient with                            severe systemic disease. After reviewing the risks                            and benefits, the patient was deemed in                            satisfactory condition to undergo the procedure.  After obtaining informed consent, the colonoscope                            was passed under direct vision. Throughout the                            procedure, the patient's blood pressure, pulse, and                            oxygen saturations were monitored continuously. The                            Colonoscope was introduced through the anus and                            advanced to the cecum,  identified by appendiceal                            orifice and ileocecal valve. The colonoscopy was                            performed without difficulty. The patient tolerated                            the procedure well. The quality of the bowel                            preparation was good. The ileocecal valve,                            appendiceal orifice, and rectum were photographed. Scope In: 8:16:55 AM Scope Out: 8:32:31 AM Scope Withdrawal Time: 0 hours 11 minutes 33 seconds  Total Procedure Duration: 0 hours 15 minutes 36 seconds  Findings:                 The digital rectal exam was normal.                           A 4 mm polyp was found in the sigmoid colon. The                            polyp was sessile. The polyp was removed with a                            cold snare. Resection and retrieval were complete.                           Multiple small and large-mouthed diverticula were                            found in the sigmoid colon, descending colon and                            ascending colon.  Internal hemorrhoids were found during                            retroflexion. The hemorrhoids were small. Complications:            No immediate complications. Estimated Blood Loss:     Estimated blood loss was minimal. Impression:               - One 4 mm polyp in the sigmoid colon, removed with                            a cold snare. Resected and retrieved.                           - Diverticulosis in the sigmoid colon, in the                            descending colon and in the ascending colon.                           - Small internal hemorrhoids. Recommendation:           - Patient has a contact number available for                            emergencies. The signs and symptoms of potential                            delayed complications were discussed with the                            patient. Return to normal activities  tomorrow.                            Written discharge instructions were provided to the                            patient.                           - Resume previous diet.                           - Continue present medications.                           - Await pathology results.                           - Repeat colonoscopy is recommended for                            surveillance. The colonoscopy date will be                            determined after pathology results from today's  exam become available for review. Jerene Bears, MD 03/24/2019 8:35:50 AM This report has been signed electronically.

## 2019-03-24 NOTE — Progress Notes (Signed)
No problems noted in the recovery room. maw 

## 2019-03-24 NOTE — Progress Notes (Signed)
Called to room to assist during endoscopic procedure.  Patient ID and intended procedure confirmed with present staff. Received instructions for my participation in the procedure from the performing physician.  

## 2019-03-24 NOTE — Progress Notes (Signed)
Pt. Reports no change in his medical or surgical history since his pre-visit 6/1//2020.  VS and Temp check by Florence and JB.

## 2019-03-24 NOTE — Patient Instructions (Addendum)
YOU HAD AN ENDOSCOPIC PROCEDURE TODAY AT [facility]:   Refer to the procedure report that was given to you for any specific questions about what was found during the examination.  If the procedure report does not answer your questions, please call your gastroenterologist to clarify.  If you requested that your care partner not be given the details of your procedure findings, then the procedure report has been included in a sealed envelope for you to review at your convenience later.  YOU SHOULD EXPECT: Some feelings of bloating in the abdomen. Passage of more gas than usual.  Walking can help get rid of the air that was put into your GI tract during the procedure and reduce the bloating. If you had a lower endoscopy (such as a colonoscopy or flexible sigmoidoscopy) you may notice spotting of blood in your stool or on the toilet paper. If you underwent a bowel prep for your procedure, you may not have a normal bowel movement for a few days.  Please Note:  You might notice some irritation and congestion in your nose or some drainage.  This is from the oxygen used during your procedure.  There is no need for concern and it should clear up in a day or so.  SYMPTOMS TO REPORT IMMEDIATELY:   Following lower endoscopy (colonoscopy or flexible sigmoidoscopy):  Excessive amounts of blood in the stool  Significant tenderness or worsening of abdominal pains  Swelling of the abdomen that is new, acute  Fever of 100F or higher   For urgent or emergent issues, a gastroenterologist can be reached at any hour by calling [phone].   DIET:  We do recommend a small meal at first, but then you may proceed to your regular diet.  Drink plenty of fluids but you should avoid alcoholic beverages for 24 hours.  ACTIVITY:  You should plan to take it easy for the rest of today and you should NOT DRIVE or use heavy machinery until tomorrow (because of the sedation medicines used during the test).     FOLLOW UP: Our staff will call the number listed on your records 48-72 hours following your procedure to check on you and address any questions or concerns that you may have regarding the information given to you following your procedure. If we do not reach you, we will leave a message.  We will attempt to reach you two times.  During this call, we will ask if you have developed any symptoms of COVID 19. If you develop any symptoms (ie: fever, flu-like symptoms, shortness of breath, cough etc.) before then, please call [phone].  If you test positive for Covid 19 in the 2 weeks post procedure, please call and report this information to [place].    If any biopsies were taken you will be contacted by phone or by letter within the next 1-3 weeks.  Please call us at [phone] if you have not heard about the biopsies in 3 weeks.    SIGNATURES/CONFIDENTIALITY: You and/or your care partner have signed paperwork which will be entered into your electronic medical record.  These signatures attest to the fact that that the information above on your After Visit Summary has been reviewed and is understood.  Full responsibility of the confidentiality of this discharge information lies with you and/or your care-partner.    Handouts were given to your care partner on polyps, diverticulosis, and hemorroids. You may resume your current medications today. Await biopsy results. Please call  if any questions or concerns. Your blood sugar was 194 in the recovery room.

## 2019-03-24 NOTE — Progress Notes (Signed)
Report given to PACU, vss 

## 2019-03-26 ENCOUNTER — Telehealth: Payer: Self-pay

## 2019-03-26 NOTE — Telephone Encounter (Signed)
Called [phone] and left a messaged we tried to reach pt for a follow up call. maw

## 2019-03-26 NOTE — Telephone Encounter (Signed)
No answer on 2nd follow up call.

## 2019-03-30 ENCOUNTER — Encounter: Payer: Self-pay | Admitting: Internal Medicine

## 2019-05-08 ENCOUNTER — Telehealth: Payer: Self-pay

## 2019-05-08 NOTE — Telephone Encounter (Signed)
Left detailed VM w COVID screen and back door lab info   

## 2019-05-12 ENCOUNTER — Other Ambulatory Visit: Payer: Self-pay

## 2019-05-12 ENCOUNTER — Other Ambulatory Visit (INDEPENDENT_AMBULATORY_CARE_PROVIDER_SITE_OTHER): Payer: BC Managed Care – PPO

## 2019-05-12 DIAGNOSIS — E0865 Diabetes mellitus due to underlying condition with hyperglycemia: Secondary | ICD-10-CM | POA: Diagnosis not present

## 2019-05-12 LAB — POCT GLYCOSYLATED HEMOGLOBIN (HGB A1C): Hemoglobin A1C: 9.1 % — AB (ref 4.0–5.6)

## 2019-05-20 ENCOUNTER — Telehealth: Payer: Self-pay

## 2019-05-20 ENCOUNTER — Telehealth: Payer: Self-pay | Admitting: Internal Medicine

## 2019-05-20 NOTE — Telephone Encounter (Signed)
See lab notes. Called pt again and provided my hours today.

## 2019-05-20 NOTE — Telephone Encounter (Signed)
Patient returned Shannon's call.  Patient said he hasn't been taking Liraglutide 1.2.  Patient was told to take half a dose for 3-4 days  before he starts taking a full dose.  Patient said he's been out of town and was worried about the side effects and wanted to wait until he'd be home to start the medication. Patient has been taking Glipizide and Metformin.  Patient will start the Liraglutide half dose tomorrow and stop taking the Glipizide and Metformin. Patient said when he finishes the 1 pen then he'll let Dr.Letvak know how he's feeling and then patient can get a rx for a higher dose.

## 2019-05-20 NOTE — Telephone Encounter (Signed)
[facility] - Client Nonclinical Telephone Record AccessNurse Client [facility] Night - Client Client Site [place] Physician Viviana Simpler - MD Contact Type Call Who Is Calling Patient / Member / Family / Caregiver Caller Name [facility] Phone Number [phone] Call Type Message Only Information Provided Reason for Call Returning a Call from the Office Initial [facility] is returning call for [facility] Additional Comment Caller is returning call for [facility]. Office hours provided. Call Closed By: Marshell Garfinkel Transaction Date/Time: 05/19/2019 5:31:27 PM (ET)

## 2019-05-21 NOTE — Telephone Encounter (Signed)
NO!!!!!!! He needs to continue the glipizide and metformin!!!!!!  Okay to start at 0.16ml for a few days and then go up to 0.20ml daily

## 2019-05-21 NOTE — Telephone Encounter (Signed)
Spoke to pt. He appreciated the call. He will take all 3 medications.

## 2019-06-04 ENCOUNTER — Encounter: Payer: Self-pay | Admitting: Family Medicine

## 2019-06-04 ENCOUNTER — Ambulatory Visit (INDEPENDENT_AMBULATORY_CARE_PROVIDER_SITE_OTHER): Payer: BC Managed Care – PPO | Admitting: Family Medicine

## 2019-06-04 ENCOUNTER — Telehealth: Payer: Self-pay

## 2019-06-04 VITALS — BP 135/82 | HR 81 | Temp 97.7°F | Ht 70.0 in | Wt 282.0 lb

## 2019-06-04 DIAGNOSIS — J301 Allergic rhinitis due to pollen: Secondary | ICD-10-CM | POA: Diagnosis not present

## 2019-06-04 DIAGNOSIS — M9903 Segmental and somatic dysfunction of lumbar region: Secondary | ICD-10-CM | POA: Diagnosis not present

## 2019-06-04 DIAGNOSIS — M9904 Segmental and somatic dysfunction of sacral region: Secondary | ICD-10-CM | POA: Diagnosis not present

## 2019-06-04 DIAGNOSIS — E1165 Type 2 diabetes mellitus with hyperglycemia: Secondary | ICD-10-CM

## 2019-06-04 DIAGNOSIS — M461 Sacroiliitis, not elsewhere classified: Secondary | ICD-10-CM | POA: Diagnosis not present

## 2019-06-04 DIAGNOSIS — M545 Low back pain: Secondary | ICD-10-CM | POA: Diagnosis not present

## 2019-06-04 DIAGNOSIS — R059 Cough, unspecified: Secondary | ICD-10-CM

## 2019-06-04 DIAGNOSIS — R05 Cough: Secondary | ICD-10-CM | POA: Diagnosis not present

## 2019-06-04 DIAGNOSIS — IMO0001 Reserved for inherently not codable concepts without codable children: Secondary | ICD-10-CM

## 2019-06-04 MED ORDER — GUAIFENESIN-CODEINE 100-10 MG/5ML PO SYRP: 5.0000 mL | ORAL_SOLUTION | Freq: Every evening | ORAL | 0 refills | Status: DC | PRN

## 2019-06-04 MED ORDER — BENZONATATE 200 MG PO CAPS: 200.0000 mg | ORAL_CAPSULE | Freq: Two times a day (BID) | ORAL | 0 refills | Status: DC | PRN

## 2019-06-04 MED ORDER — PREDNISONE 10 MG PO TABS: 10.0000 mg | ORAL_TABLET | Freq: Every day | ORAL | 0 refills | Status: DC

## 2019-06-04 NOTE — Progress Notes (Addendum)
VIRTUAL VISIT Due to national recommendations of social distancing due to [employer] 19, a virtual visit is felt to be most appropriate for this patient at this time.   I connected with the patient on 06/04/19 at  4:00 PM EDT by virtual telehealth platform and verified that I am speaking with the correct person using two identifiers.   I discussed the limitations, risks, security and privacy concerns of performing an evaluation and management service by  virtual telehealth platform and the availability of in person appointments. I also discussed with the patient that there may be a patient responsible charge related to this service. The patient expressed understanding and agreed to proceed.  Patient location: Home Provider Location: [place] Participants: Eliezer Lofts and Vanetta Shawl   Chief Complaint  Patient presents with  . Cough  . Headache    History of Present Illness: 58 year old male with DM, poorly controlled presents for  Cough eval. Lab Results  Component Value Date   HGBA1C 9.1 (A) 05/12/2019   Cough This is a new problem. The current episode started in the past 7 days (6 days, started after being in a lot odf dust). The problem has been gradually worsening. The problem occurs constantly. The cough is productive of sputum. Associated symptoms include headaches and nasal congestion. Pertinent negatives include no chest pain, chills, ear congestion, ear pain, fever, myalgias, rash, sore throat, shortness of breath or wheezing. Associated symptoms comments: headache. The symptoms are aggravated by lying down. Risk factors: nonsmoker. Treatments tried: benzonatate. The treatment provided moderate relief. His past medical history is significant for environmental allergies. There is no history of asthma or COPD.  Headache  Associated symptoms include coughing. Pertinent negatives include no ear pain, fever or sore throat.     COVID 19 screen No recent travel or known  exposure to COVID19 The patient denies respiratory symptoms of COVID 19 at this time.  The importance of social distancing was discussed today.   Review of Systems  Constitutional: Negative for chills and fever.  HENT: Negative for ear pain and sore throat.   Respiratory: Positive for cough. Negative for shortness of breath and wheezing.   Cardiovascular: Negative for chest pain.  Musculoskeletal: Negative for myalgias.  Skin: Negative for rash.  Neurological: Positive for headaches.  Endo/Heme/Allergies: Positive for environmental allergies.      Past Medical History:  Diagnosis Date  . Allergic rhinitis due to pollen   . Bilateral carpal tunnel syndrome   . Type II or unspecified type diabetes mellitus without mention of complication, not stated as uncontrolled     reports that he has never smoked. He has never used smokeless tobacco. He reports that he does not drink alcohol or use drugs.   Current Outpatient Medications:  .  glipiZIDE (GLUCOTROL) 5 MG tablet, TAKE 1 TABLET(5 MG) BY MOUTH DAILY BEFORE BREAKFAST, Disp: 90 tablet, Rfl: 3 .  liraglutide (VICTOZA) 18 MG/3ML SOPN, Inject 0.2 mLs (1.2 mg total) into the skin daily. Take just 0.70ml for the first few days., Disp: 6 mL, Rfl: 11 .  loratadine (CLARITIN) 10 MG tablet, Take 10 mg by mouth as needed for allergies., Disp: , Rfl:  .  metFORMIN (GLUCOPHAGE-XR) 750 MG 24 hr tablet, TAKE 1 TABLET BY MOUTH THREE TIMES DAILY, Disp: 270 tablet, Rfl: 3  Current Facility-Administered Medications:  .  0.9 %  sodium chloride infusion, 500 mL, Intravenous, Once, Pyrtle, Lajuan Lines, MD   Observations/Objective: Blood pressure 135/82, pulse 81,  temperature 97.7 F (36.5 C), temperature source Oral, height 5\' 10"  (1.778 m), weight 282 lb (127.9 kg).  Physical Exam  Physical Exam Constitutional:      General: The patient is not in acute distress. Pulmonary:     Effort: Pulmonary effort is normal. No respiratory distress.  Neurological:      Mental Status: The patient is alert and oriented to person, place, and time.  Psychiatric:        Mood and Affect: Mood normal.        Behavior: Behavior normal.   Assessment and Plan   Cough Possible cough variant asthma.. triggered by recent exposure to dust.  No clear sign of infection but will send for covid testing just in case.  treat with low dose pred taper, cough suppressants and change to zyrtec at bedtime.   Allergic rhinitis due to pollen Change to zyrtec or Xyzal.  Diabetes mellitus type 2, uncontrolled, without complications Follow CBGs closely while on prednisone.   I discussed the assessment and treatment plan with the patient. The patient was provided an opportunity to ask questions and all were answered. The patient agreed with the plan and demonstrated an understanding of the instructions.   The patient was advised to call back or seek an in-person evaluation if the symptoms worsen or if the condition fails to improve as anticipated.     Eliezer Lofts, MD

## 2019-06-04 NOTE — Assessment & Plan Note (Signed)
Possible cough variant asthma.. triggered by recent exposure to dust.  No clear sign of infection but will send for covid testing just in case.  treat with low dose pred taper, cough suppressants and change to zyrtec at bedtime.

## 2019-06-04 NOTE — Patient Instructions (Addendum)
Change claritin to zyrtec at bedtime. Follow blood sugar while on prednisone taper.

## 2019-06-04 NOTE — Assessment & Plan Note (Signed)
Follow CBGs closely while on prednisone 

## 2019-06-04 NOTE — Assessment & Plan Note (Signed)
Change to zyrtec or Xyzal.

## 2019-06-04 NOTE — Telephone Encounter (Signed)
Agree with plan 

## 2019-06-04 NOTE — Telephone Encounter (Signed)
Pt tries to stay out of dusty areas; on 06/01/19 pt started with prod cough with green phlegm, H/A, sinus pressure. No CP,no fever, no S/T and no SOB. Pt has to travel for a job in [place] on 06/08/19 and request med to get rid of symptoms. Pt has virtual appt today at 4:00 with Dr Diona Browner.Pt has no covid symptoms except the cough and H/A, no travel and on 05/17/19 was at church and was around a person that tested positive for covid the first part of this week. Pt wore mask at church until seated but was not around + covid individual when seated.

## 2019-06-05 ENCOUNTER — Telehealth: Payer: Self-pay

## 2019-06-05 ENCOUNTER — Other Ambulatory Visit: Payer: Self-pay

## 2019-06-05 DIAGNOSIS — R6889 Other general symptoms and signs: Secondary | ICD-10-CM | POA: Diagnosis not present

## 2019-06-05 DIAGNOSIS — M461 Sacroiliitis, not elsewhere classified: Secondary | ICD-10-CM | POA: Diagnosis not present

## 2019-06-05 DIAGNOSIS — M9904 Segmental and somatic dysfunction of sacral region: Secondary | ICD-10-CM | POA: Diagnosis not present

## 2019-06-05 DIAGNOSIS — M545 Low back pain: Secondary | ICD-10-CM | POA: Diagnosis not present

## 2019-06-05 DIAGNOSIS — Z20822 Contact with and (suspected) exposure to covid-19: Secondary | ICD-10-CM

## 2019-06-05 DIAGNOSIS — M9903 Segmental and somatic dysfunction of lumbar region: Secondary | ICD-10-CM | POA: Diagnosis not present

## 2019-06-05 NOTE — Telephone Encounter (Signed)
Correction Instructions should read: 3 tabs by mouth daily x 3 days, then 2 tabs by mouth daily x 2 days then 1 tab by mouth daily x 2 days #15 -0 RF.Marland KitchenMarland Kitchen

## 2019-06-05 NOTE — Telephone Encounter (Signed)
Pt had virtual visit on 06/04/19 and pt understood that Dr Diona Browner was prescribing a low dose prednisone taper but when pt picked up med it is prednisone 10 mg taking 1 tab daily po at breakfast. Pt wants to verify if these instructions are correct or is pt supposed to have a pred taper. Pt request cb.

## 2019-06-05 NOTE — Telephone Encounter (Signed)
Mr. Derrick Kennedy notified as instructed by telephone.  Patient states understanding.

## 2019-06-07 ENCOUNTER — Other Ambulatory Visit: Payer: Self-pay

## 2019-06-07 ENCOUNTER — Emergency Department: Payer: BC Managed Care – PPO

## 2019-06-07 ENCOUNTER — Emergency Department
Admission: EM | Admit: 2019-06-07 | Discharge: 2019-06-07 | Disposition: A | Discharge status: Home / Self Care | Payer: BC Managed Care – PPO | Attending: Emergency Medicine | Admitting: Emergency Medicine

## 2019-06-07 DIAGNOSIS — Z7984 Long term (current) use of oral hypoglycemic drugs: Secondary | ICD-10-CM | POA: Insufficient documentation

## 2019-06-07 DIAGNOSIS — U071 COVID-19: Secondary | ICD-10-CM | POA: Diagnosis not present

## 2019-06-07 DIAGNOSIS — R05 Cough: Secondary | ICD-10-CM | POA: Diagnosis not present

## 2019-06-07 DIAGNOSIS — E119 Type 2 diabetes mellitus without complications: Secondary | ICD-10-CM | POA: Insufficient documentation

## 2019-06-07 DIAGNOSIS — R0602 Shortness of breath: Secondary | ICD-10-CM | POA: Diagnosis not present

## 2019-06-07 DIAGNOSIS — Z79899 Other long term (current) drug therapy: Secondary | ICD-10-CM | POA: Diagnosis not present

## 2019-06-07 LAB — COMPREHENSIVE METABOLIC PANEL
ALT: 83 U/L — ABNORMAL HIGH (ref 0–44)
AST: 60 U/L — ABNORMAL HIGH (ref 15–41)
Albumin: 3.6 g/dL (ref 3.5–5.0)
Alkaline Phosphatase: 32 U/L — ABNORMAL LOW (ref 38–126)
Anion gap: 9 (ref 5–15)
BUN: 14 mg/dL (ref 6–20)
CO2: 26 mmol/L (ref 22–32)
Calcium: 8.8 mg/dL — ABNORMAL LOW (ref 8.9–10.3)
Chloride: 103 mmol/L (ref 98–111)
Creatinine, Ser: 0.68 mg/dL (ref 0.61–1.24)
GFR calc Af Amer: >60 mL/min (ref >60)
GFR calc non Af Amer: >60 mL/min (ref >60)
Glucose, Bld: 240 mg/dL — ABNORMAL HIGH (ref 70–99)
Potassium: 3.4 mmol/L — ABNORMAL LOW (ref 3.5–5.1)
Sodium: 138 mmol/L (ref 135–145)
Total Bilirubin: 0.4 mg/dL (ref 0.3–1.2)
Total Protein: 7.5 g/dL (ref 6.5–8.1)

## 2019-06-07 LAB — CBC WITH DIFFERENTIAL/PLATELET
Abs Immature Granulocytes: 0.03 10*3/uL (ref 0.00–0.07)
Basophils Absolute: 0 10*3/uL (ref 0.0–0.1)
Basophils Relative: 0 %
Eosinophils Absolute: 0 10*3/uL (ref 0.0–0.5)
Eosinophils Relative: 0 %
HCT: 37.3 % — ABNORMAL LOW (ref 39.0–52.0)
Hemoglobin: 12.4 g/dL — ABNORMAL LOW (ref 13.0–17.0)
Immature Granulocytes: 1 %
Lymphocytes Relative: 15 %
Lymphs Abs: 1 10*3/uL (ref 0.7–4.0)
MCH: 30.8 pg (ref 26.0–34.0)
MCHC: 33.2 g/dL (ref 30.0–36.0)
MCV: 92.6 fL (ref 80.0–100.0)
Monocytes Absolute: 0.5 10*3/uL (ref 0.1–1.0)
Monocytes Relative: 7 %
Neutro Abs: 5 10*3/uL (ref 1.7–7.7)
Neutrophils Relative %: 77 %
Platelets: 282 10*3/uL (ref 150–400)
RBC: 4.03 MIL/uL — ABNORMAL LOW (ref 4.22–5.81)
RDW: 13.5 % (ref 11.5–15.5)
WBC: 6.6 10*3/uL (ref 4.0–10.5)
nRBC: 0 % (ref 0.0–0.2)

## 2019-06-07 LAB — TROPONIN I (HIGH SENSITIVITY)
Troponin I (High Sensitivity): 8 ng/L (ref <18)
Troponin I (High Sensitivity): 8 ng/L (ref <18)

## 2019-06-07 LAB — NOVEL CORONAVIRUS, NAA: SARS-CoV-2, NAA: DETECTED — AB

## 2019-06-07 LAB — BETA-HYDROXYBUTYRIC ACID: Beta-Hydroxybutyric Acid: 0.14 mmol/L (ref 0.05–0.27)

## 2019-06-07 MED ORDER — GUAIFENESIN-DM 100-10 MG/5ML PO SYRP
5.0000 mL | ORAL_SOLUTION | Freq: Once | ORAL | Status: AC
  Administered 2019-06-07: 5 mL via ORAL
  Filled 2019-06-07: qty 5

## 2019-06-07 MED ORDER — SODIUM CHLORIDE 0.9 % IV BOLUS
500.0000 mL | Freq: Once | INTRAVENOUS | Status: AC
  Administered 2019-06-07: 500 mL via INTRAVENOUS

## 2019-06-07 MED ORDER — ACETAMINOPHEN 500 MG PO TABS
1000.0000 mg | ORAL_TABLET | Freq: Once | ORAL | Status: AC
  Administered 2019-06-07: 1000 mg via ORAL
  Filled 2019-06-07: qty 2

## 2019-06-07 MED ORDER — KETOROLAC TROMETHAMINE 30 MG/ML IJ SOLN
30.0000 mg | Freq: Once | INTRAMUSCULAR | Status: AC
  Administered 2019-06-07: 16:00:00 30 mg via INTRAVENOUS
  Filled 2019-06-07: qty 1

## 2019-06-07 NOTE — ED Provider Notes (Addendum)
[facility] Emergency Department Provider Note  ____________________________________________   First MD Initiated Contact with Patient 06/07/19 1419     (approximate)  I have reviewed the triage vital signs and the nursing notes.   HISTORY  Chief Complaint Shortness of Breath    HPI Derrick Kennedy is a 58 y.o. male type 2 diabetes who presents with shortness of breath.  Patient was positive for coronavirus on 9/4, 2 days ago.  Patient's been having symptoms for about 1 week.  Patient's been having headaches, fevers, cough, shortness of breath.  The shortness of breath started 2 days ago, he says it is minimal in nature, nothing makes it better, nothing makes it worse.  He denies any chest pain. He is most concerning about his cough that has been more severe and he says his sugars have been in the 200s.          Past Medical History:  Diagnosis Date  . Allergic rhinitis due to pollen   . Bilateral carpal tunnel syndrome   . Type II or unspecified type diabetes mellitus without mention of complication, not stated as uncontrolled     Patient Active Problem List   Diagnosis Date Noted  . Elevated liver function tests 04/16/2017  . Amaurosis fugax 10/16/2016  . Hyperlipemia 12/22/2014  . Morbid obesity ([place]) 04/20/2014  . Routine general medical examination at a health care facility 08/10/2013  . Diabetes mellitus type 2, uncontrolled, without complications ([place])   . Allergic rhinitis due to pollen   . Cough 06/29/2010    Past Surgical History:  Procedure Laterality Date  . APPENDECTOMY  1976  . CARPAL TUNNEL RELEASE Bilateral 11/24/13   Dr Margaretmary Eddy  . COLONOSCOPY  10/05/2013  . FOOT SURGERY  2004   right heel reconstruction  . POLYPECTOMY      Prior to Admission medications   Medication Sig Start Date End Date Taking? Authorizing Provider  benzonatate (TESSALON) 200 MG capsule Take 1 capsule (200 mg total) by mouth 2 (two) times daily  as needed for cough. 06/04/19   Bedsole, Amy E, MD  glipiZIDE (GLUCOTROL) 5 MG tablet TAKE 1 TABLET(5 MG) BY MOUTH DAILY BEFORE BREAKFAST 02/09/19   Viviana Simpler I, MD  guaiFENesin-codeine (ROBITUSSIN AC) 100-10 MG/5ML syrup Take 5-10 mLs by mouth at bedtime as needed for cough. 06/04/19   Bedsole, Amy E, MD  liraglutide (VICTOZA) 18 MG/3ML SOPN Inject 0.2 mLs (1.2 mg total) into the skin daily. Take just 0.22ml for the first few days. 02/09/19   Venia Carbon, MD  loratadine (CLARITIN) 10 MG tablet Take 10 mg by mouth as needed for allergies.    [provider]  metFORMIN (GLUCOPHAGE-XR) 750 MG 24 hr tablet TAKE 1 TABLET BY MOUTH THREE TIMES DAILY 02/09/19   Venia Carbon, MD  predniSONE (DELTASONE) 10 MG tablet Take 1 tablet (10 mg total) by mouth daily with breakfast. 06/04/19   Jinny Sanders, MD    Allergies Patient has no known allergies.  Family History  Problem Relation Age of Onset  . Diabetes Brother   . Kidney failure Brother   . Cancer Neg Hx   . Colon cancer Neg Hx   . Esophageal cancer Neg Hx   . Rectal cancer Neg Hx   . Stomach cancer Neg Hx   . Colon polyps Neg Hx     Social History Social History   Tobacco Use  . Smoking status: Never Smoker  . Smokeless tobacco:  Never Used  Substance Use Topics  . Alcohol use: No  . Drug use: No      Review of Systems Constitutional: No fever/chills Eyes: No visual changes. ENT: No sore throat. Cardiovascular: No chest pain Respiratory: Positive for SOB positive cough Gastrointestinal: No abdominal pain.  No nausea, no vomiting.  No diarrhea.  No constipation. Genitourinary: Negative for dysuria. Musculoskeletal: Negative for back pain. Skin: Negative for rash. Neurological: Positive for headache, no focal weakness or numbness. All other ROS negative ____________________________________________   PHYSICAL EXAM:  VITAL SIGNS: ED Triage Vitals  Enc Vitals Group     BP 06/07/19 1409 (!) 142/76      Pulse Rate 06/07/19 1409 97     Resp 06/07/19 1409 18     Temp 06/07/19 1409 98.7 F (37.1 C)     Temp Source 06/07/19 1409 Oral     SpO2 06/07/19 1409 97 %     Weight 06/07/19 1415 282 lb (127.9 kg)     Height 06/07/19 1415 5\' 10"  (1.778 m)     Head Circumference --      Peak Flow --      Pain Score 06/07/19 1414 7     Pain Loc --      Pain Edu? --      Excl. in [place]? --     Constitutional: Alert and oriented. Well appearing and in no acute distress.  Occasional coughing Eyes: Conjunctivae are normal. EOMI. Head: Atraumatic. Nose: No congestion/rhinnorhea. Mouth/Throat: Mucous membranes are moist.   Neck: No stridor. Trachea Midline. FROM Cardiovascular: Normal rate, regular rhythm. Grossly normal heart sounds.  Good peripheral circulation. Respiratory: Mild increased work of breathing, no stridor Gastrointestinal: Soft and nontender. No distention. No abdominal bruits.  Musculoskeletal: No lower extremity tenderness nor edema.  No joint effusions. Neurologic:  Normal speech and language. No gross focal neurologic deficits are appreciated.  Skin:  Skin is warm, dry and intact. No rash noted. Psychiatric: Mood and affect are normal. Speech and behavior are normal. GU: Deferred   ____________________________________________   LABS (all labs ordered are listed, but only abnormal results are displayed)  Labs Reviewed  CBC WITH DIFFERENTIAL/PLATELET - Abnormal; Notable for the following components:      Result Value   RBC 4.03 (*)    Hemoglobin 12.4 (*)    HCT 37.3 (*)    All other components within normal limits  COMPREHENSIVE METABOLIC PANEL - Abnormal; Notable for the following components:   Potassium 3.4 (*)    Glucose, Bld 240 (*)    Calcium 8.8 (*)    AST 60 (*)    ALT 83 (*)    Alkaline Phosphatase 32 (*)    All other components within normal limits  BETA-HYDROXYBUTYRIC ACID  TROPONIN I (HIGH SENSITIVITY)  TROPONIN I (HIGH SENSITIVITY)    ____________________________________________   ED ECG REPORT I, Vanessa , the attending physician, personally viewed and interpreted this ECG.  EKG is sinus rate of 91, no ST elevation, no T wave inversion, normal intervals,. ____________________________________________  RADIOLOGY Robert Bellow, personally viewed and evaluated these images (plain radiographs) as part of my medical decision making, as well as reviewing the written report by the radiologist.  ED MD interpretation: Bilateral infiltrates consistent with known coronavirus.  Official radiology report(s): Dg Chest 1 View  Result Date: 06/07/2019 CLINICAL DATA:  Pt c/o having a cough with increased SOB and severe HA since Tuesday and was called today with a positive covid test.  Pt is going into coughing fits in triage. Nonsmoker. Hx of DM. EXAM: CHEST  1 VIEW COMPARISON:  06/29/2010 FINDINGS: Shallow lung inflation. Heart size is normal. There are faint patchy opacities throughout the lungs bilaterally, without confluent consolidation. No evidence for pulmonary edema. IMPRESSION: Bilateral pulmonary infiltrates. Electronically Signed   By: Nolon Nations M.D.   On: 06/07/2019 15:18    ____________________________________________   PROCEDURES  Procedure(s) performed (including Critical Care):  Procedures   ____________________________________________   INITIAL IMPRESSION / ASSESSMENT AND PLAN / ED COURSE   Derrick Kennedy was evaluated in Emergency Department on 06/07/2019 for the symptoms described in the history of present illness. He was evaluated in the context of the global COVID-19 pandemic, which necessitated consideration that the patient might be at risk for infection with the SARS-CoV-2 virus that causes COVID-19. Institutional protocols and algorithms that pertain to the evaluation of patients at risk for COVID-19 are in a state of rapid change based on information released by regulatory bodies including  the CDC and federal and state organizations. These policies and algorithms were followed during the patient's care in the ED.     Pt presents with SOB.  This is most likely secondary to his known coronavirus.  PNA-will get xray to evaluation Anemia-CBC to evaluate ACS- will get trops Arrhythmia-Will get EKG and keep on monitor.  PE-lower suspicion given no risk factors and given patient reports that shortness of breath is minimal it seems like it is less likely to be PE. I d/w pt CT imaging and okay with holding off at this point given. He has no leg swelling to suggest DVT.  We will give patient some cough syrup and Tylenol for his headache.  Will get basic labs to evaluate for electrolyte abnormalities, ACS, DKA.  Will get chest x-ray to evaluate for pleural effusion or pneumothorax.  Patient with ambulating is satting 95%.  However he did get tachycardic with ambulating to 120.  Will give a 500 cc of fluid.  Repeat trop stable.  D/w pt he is feeling much better. I discussed with pt that given Sats have not dropped below 90% even with ambulating and that he looks so comfortable at this time that he could be d/c home. Pt lives with wife and felt comfortable with that plan.  I discussed low threshold to return given that he could get worse in the next 1-2 days.  He expressed understanding. ________________________________________   FINAL CLINICAL IMPRESSION(S) / ED DIAGNOSES   Final diagnoses:  COVID-19     MEDICATIONS GIVEN DURING THIS VISIT:  Medications  sodium chloride 0.9 % bolus 500 mL (has no administration in time range)  acetaminophen (TYLENOL) tablet 1,000 mg (1,000 mg Oral Given 06/07/19 1456)  guaiFENesin-dextromethorphan (ROBITUSSIN DM) 100-10 MG/5ML syrup 5 mL (5 mLs Oral Given 06/07/19 1456)     ED Discharge Orders    None       Note:  This document was prepared using Dragon voice recognition software and may include unintentional dictation errors.   Vanessa [place], MD 06/07/19 1553    Vanessa [place], MD 06/07/19 [phone]

## 2019-06-07 NOTE — ED Triage Notes (Signed)
Pt c/o having a cough with increased SOB and severe HA since Tuesday and was called today with a positive covid test. Pt is going into coughing fits in triage.Marland Kitchen

## 2019-06-07 NOTE — Discharge Instructions (Addendum)
Your work-up was reassuring.  You feel short of breath due to the coronavirus.  If you develop worsening shortness of breath you should be rechecked in order to see  where your saturation levels are doing.  Otherwise you can take Tylenol for your headaches.  Take the cough medicine provided by your doctor as well.   Return to the Ed for worsening SOB or any other concerns.

## 2019-06-09 ENCOUNTER — Telehealth: Payer: Self-pay

## 2019-06-09 NOTE — Telephone Encounter (Signed)
I spoke with pt's wife about covid monitoring. Today pt has just laid down; had a hard night last night. Pt is presently having SOB upon any exertion, like getting up to go go bathroom. If pt is sitting pt is not SOB.    Pt has dry hacky cough and worsens if tries to move around the cough worsens. If pt is still the dry hacky cough is not bad.   Pt still has h/a; does not seem as bad as yesterday; if pt is still h/a is not as bad;pt's wife thinks pt has been alternating aleve and tylenol. Pt wife wants to know if can take ibuprofen,aleve and tylenol. Advise should not take all three meds. pts wife said gets best relief with aleve alternating with Tylenol. pts wife has not talked about body aches today.  Pt has poor appetite but is eating soup,crackers and sugar free jello.   Pt stopped the prednisone due to instructions of nurse who told him he had + covid. Pt does not take robitussin ac due to keeping pt up at night. Pt is taking the tessalon perle and that seems to help the cough.   06/07/19 pt went to ED and got IV which did not seem to help. Pt was + with covid on 06/07/19.   During the night last night pt had vomiting and could not keep liquids down.  06/09/19 pt has been able to keep liquids and food down.   I spoake with Mandy RN clinical mgr and she said If ED ordered prednisone pt should go back on prednisone. Pt's wife said the ED did no prescribe the prednisone([place] provider ordered prednisone) but the nurse that gave the covid + results did tell pt to stop prednisone. Pt will ck with ED doctor about taking the prednisone now. Also if pt is having any SOB resting or upon exertion pt should go back to ED for eval. Pt BS today was 270.  pts wife said pt has severe SOB upon getting up to go to bathroom and she will call [facility] ED and let them know she is bringing pt to ED for eval of SOB and what med to take. FYI to Dr Silvio Pate.

## 2019-06-09 NOTE — Telephone Encounter (Signed)
Per chart review tab pt seen [facility] ED on 06/07/19.

## 2019-06-09 NOTE — Telephone Encounter (Signed)
[place] Night - Client TELEPHONE ADVICE RECORD [facility] Patient Name: Derrick Kennedy Gender: Male DOB: Jul 15, 1961 Age: 58 Y 69 M 28 D Return Phone Number: [phone] (Primary), [phone] (Secondary) Address: City/State/Zip: [place] Night - Client Client Site [facility] Physician Viviana Simpler - MD Contact Type Call Who Is Calling Patient / Member / Family / Caregiver Call Type Triage / Clinical Relationship To Patient Self Return Phone Number [phone] (Primary) Chief Complaint Headache Reason for Call Symptomatic / Request for [place] states that he has a positive COVID with a really bad cough and his head feels like it's been crushed. He wants to know what to do for treatment. Translation No Nurse Assessment Nurse: Cain Sieve, RN, Clarise Cruz Date/Time (Eastern Time): 06/07/2019 11:53:42 AM Confirm and document reason for call. If symptomatic, describe symptoms. ---Caller states that he has a positive COVID with a really bad cough and his head feels like it's been crushed. Taking prednisone and tessalon pearls currently. Taking Aleve with minimal relief. Having body aches as well. Has the patient had close contact with a person known or suspected to have the novel coronavirus illness OR traveled / lives in area with major community spread (including international travel) in the last 14 days from the onset of symptoms? * If Asymptomatic, screen for exposure and travel within the last 14 days. ---Yes Does the patient have any new or worsening symptoms? ---Yes Will a triage be completed? ---Yes Related visit to physician within the last 2 weeks? ---Yes Does the PT have any chronic conditions? (i.e. diabetes, asthma, this includes High risk factors for pregnancy, etc.) ---Yes List chronic conditions. ---DM Is this a behavioral health or substance  abuse call? ---No Guidelines Guideline Title Affirmed Question Affirmed Notes Nurse Date/Time (Eastern Time) Coronavirus (COVID-19) - Diagnosed or Suspected MILD difficulty breathing (e.g., minimal/no SOB at rest, SOB with walking, pulse <100) Cumesty, RN, Clarise Cruz 06/07/2019 11:56:47 AM PLEASE NOTE: All timestamps contained within this report are represented as [place] Standard Time. CONFIDENTIALTY NOTICE: This fax transmission is intended only for the addressee. It contains information that is legally privileged, confidential or otherwise protected from use or disclosure. If you are not the intended recipient, you are strictly prohibited from reviewing, disclosing, copying using or disseminating any of this information or taking any action in reliance on or regarding this information. If you have received this fax in error, please notify us immediately by telephone so that we can arrange for its return to [place]. Phone: [phone], Toll-Free: [phone], Fax: [phone] Page: 2 of 3 Call Id: [id number] [place]. Time Eilene Ghazi Time) Disposition Final User 06/07/2019 12:04:06 PM Paged On Call back to [facility], RN, Clarise Cruz 06/07/2019 12:02:31 PM Call PCP Now Yes Cumesty, RN, Margurite Auerbach Disagree/Comply Comply Caller Understands Yes PreDisposition Search internet for information Care Advice Given Per Guideline CALL PCP NOW: * You need to discuss this with your doctor (or NP/PA). * I'll page the on-call provider now. If you haven't heard from the provider (or me) within 30 minutes, call again. HOW TO PROTECT OTHERS - WHEN YOU ARE SICK WITH COVID-19: * STAY HOME: Stay home from school or work if you are sick. Do NOT go to religious services, child care centers, shopping, or other public places. Do NOT use public transportation (e.g., bus, taxis, ride-sharing). Do NOT allow any visitors to your home. Leave the house only if you need to seek urgent  medical care. * COVER THE COUGH: Cough and sneeze  into your shirt sleeve or inner elbow. Don't cough into your hand or the air. If available, cough into a tissue and throw it into a trash can. * [place] HANDS OFTEN: Wash hands often with soap and water. After coughing or sneezing are important times. * WEAR A MASK: Wear a facemask when around others. Always wear a facemask (if available) if you have to leave your home (such as going to a medical facility). * CALL FIRST IF MEDICAL CARE NEEDED: Call ahead to get approval and careful directions. GENERAL CARE ADVICE FOR COVID-19 SYMPTOMS: * Feeling dehydrated: Drink extra liquids. If the air in your home is dry, use a humidifier. * Muscle aches, headache, and other pains: Often this comes and goes with the fever. Take acetaminophen every 4-6 hours (Adults 650 mg) OR ibuprofen every 6-8 hours (Adults 400 mg). Before taking any medicine, read all the instructions on the package. COUGH MEDICINES: * HOME REMEDY - HARD CANDY: Hard candy works just as well as medicine-flavored OTC cough drops. People who have diabetes should use sugar-free candy. CALL BACK IF: * You become worse. CARE ADVICE given per CORONAVIRUS (COVID-19) - DIAGNOSED OR SUSPECTED (Adult) guideline. Comments User: Rosalio Loud, RN Date/Time Eilene Ghazi Time): 06/07/2019 11:57:03 AM blood sugars running about 230 User: Rosalio Loud, RN Date/Time Eilene Ghazi Time): 06/07/2019 11:59:24 AM [facility] - [phone] NK[place] Paging DoctorName Phone DateTime Result/Outcome Message Type Notes Eliezer Lofts - MD [id number] 06/07/2019 12:04:06 PM Paged On Call Back to Call Center Doctor Paged This is Ebony Hail, Therapist, sports with [employer]/ [facility]. Could you please call me back at [phone]? Thank you! Eliezer Lofts - MD 06/07/2019 12:33:22 PM Spoke with On Call - Physician Upgrade Message Result Spoke Dr Diona Browner. She states pt needs to be seen today either at [facility] or ER. She states with his symptoms and high risk that she would prefer an ER. Also  wants nurse to remind pt that he needs to be home isolating and not working. Pt does not need to be going out of town or out of state with positive test. Called pt back and informed him of what MD stated. He verbalized PLEASE NOTE: All timestamps contained within this report are represented as [place] Standard Time. CONFIDENTIALTY NOTICE: This fax transmission is intended only for the addressee. It contains information that is legally privileged, confidential or otherwise protected from use or disclosure. If you are not the intended recipient, you are strictly prohibited from reviewing, disclosing, copying using or disseminating any of this information or taking any action in reliance on or regarding this information. If you have received this fax in error, please notify us immediately by telephone so that we can arrange for its return to [place]. Phone: [phone], Toll-Free: [phone], Fax: [phone] Page: 3 of 3 Call Id: [id number] Paging DoctorName Phone DateTime Result/Outcome Message Type Notes understanding and denied any other questions or concerns at this time.

## 2019-06-09 NOTE — Telephone Encounter (Signed)
Record reviewed Will have our routine COVID follow up to keep up with his status

## 2019-06-10 NOTE — Telephone Encounter (Signed)
Was seen in the ER 9/6---- CXR okay and sats over 90% so sent home Continue phone monitoring

## 2019-06-12 ENCOUNTER — Telehealth: Payer: Self-pay

## 2019-06-12 NOTE — Telephone Encounter (Signed)
Spoke with patient for an update.    Patient reports the following:  1.  He is getting better, slowly but better.  States his breathing is improving and no new onset or worsening of symptoms.   No fever.  Some mild dyspnea on exertion only, weakness and fatigue.   Nothing new to report at this time.  States his whole family has tested positive now and they are hunkering down under quarantine.  His family members all have mild symptoms and no breathing issues.  He was the only "lucky" one with that symptom.   He knows that I will be calling through the week next week to check on him and the family and monitor their progress.   He says the health department is also contacting them regularly as well.

## 2019-06-14 NOTE — Telephone Encounter (Signed)
Faythe Ghee Glad to hear they are all doing okay

## 2019-06-16 ENCOUNTER — Telehealth: Payer: Self-pay | Admitting: *Deleted

## 2019-06-16 NOTE — Telephone Encounter (Signed)
Tried to call patient to follow-up on him because of a positive covid 19 result.  Unable to leave a message because mailbox was full. Will try to reach patient again later.

## 2019-06-17 NOTE — Telephone Encounter (Signed)
Tried to call patient again. Unable to leave a message because mailbox was full.

## 2019-06-19 NOTE — Telephone Encounter (Signed)
Left message on voicemail for patient to call back. When patient calls back need to get update on his condition because of positive covid result.

## 2019-06-24 NOTE — Telephone Encounter (Signed)
Good to hear

## 2019-06-24 NOTE — Telephone Encounter (Signed)
Spoke with patient.  He has recovered from [place] is doing well and back at work.  D\c'ed from [place] calling list.

## 2019-08-07 ENCOUNTER — Other Ambulatory Visit: Payer: Self-pay

## 2019-08-07 ENCOUNTER — Ambulatory Visit: Payer: BC Managed Care – PPO | Admitting: Internal Medicine

## 2019-08-07 ENCOUNTER — Encounter: Payer: Self-pay | Admitting: Internal Medicine

## 2019-08-07 ENCOUNTER — Ambulatory Visit: Payer: BLUE CROSS/BLUE SHIELD | Admitting: Internal Medicine

## 2019-08-07 VITALS — BP 120/72 | HR 75 | Temp 98.2°F | Ht 70.0 in | Wt 278.0 lb

## 2019-08-07 DIAGNOSIS — R05 Cough: Secondary | ICD-10-CM | POA: Diagnosis not present

## 2019-08-07 DIAGNOSIS — E119 Type 2 diabetes mellitus without complications: Secondary | ICD-10-CM | POA: Diagnosis not present

## 2019-08-07 DIAGNOSIS — E0865 Diabetes mellitus due to underlying condition with hyperglycemia: Secondary | ICD-10-CM

## 2019-08-07 DIAGNOSIS — R059 Cough, unspecified: Secondary | ICD-10-CM

## 2019-08-07 LAB — POCT GLYCOSYLATED HEMOGLOBIN (HGB A1C): Hemoglobin A1C: 8 % — AB (ref 4.0–5.6)

## 2019-08-07 LAB — HM DIABETES FOOT EXAM

## 2019-08-07 NOTE — Progress Notes (Signed)
Subjective:    Patient ID: Derrick Kennedy, male    DOB: 07-31-1961, 58 y.o.   MRN: [id number]  HPI Here for follow up of diabetes  Never started the victoza Has changed lifestyle to improve sugars Cut out starches Exercising some more  Days are very variable On the road some days---will take lunch instead of stopping Less snacking  Lost a few pounds but thinks he would have done better except for COVID  Current Outpatient Medications on File Prior to Visit  Medication Sig Dispense Refill  . acetaminophen (TYLENOL) 325 MG tablet Take 650 mg by mouth every 6 (six) hours as needed.    Marland Kitchen glipiZIDE (GLUCOTROL) 5 MG tablet TAKE 1 TABLET(5 MG) BY MOUTH DAILY BEFORE BREAKFAST 90 tablet 3  . loratadine (CLARITIN) 10 MG tablet Take 10 mg by mouth as needed for allergies.    . metFORMIN (GLUCOPHAGE-XR) 750 MG 24 hr tablet TAKE 1 TABLET BY MOUTH THREE TIMES DAILY 270 tablet 3   No current facility-administered medications on file prior to visit.     No Known Allergies  Past Medical History:  Diagnosis Date  . Allergic rhinitis due to pollen   . Bilateral carpal tunnel syndrome   . Type II or unspecified type diabetes mellitus without mention of complication, not stated as uncontrolled     Past Surgical History:  Procedure Laterality Date  . APPENDECTOMY  1976  . CARPAL TUNNEL RELEASE Bilateral 11/24/13   Dr Margaretmary Eddy  . COLONOSCOPY  10/05/2013  . FOOT SURGERY  2004   right heel reconstruction  . POLYPECTOMY      Family History  Problem Relation Age of Onset  . Diabetes Brother   . Kidney failure Brother   . Cancer Neg Hx   . Colon cancer Neg Hx   . Esophageal cancer Neg Hx   . Rectal cancer Neg Hx   . Stomach cancer Neg Hx   . Colon polyps Neg Hx     Social History   Socioeconomic History  . Marital status: Married    Spouse name: Not on file  . Number of children: 3  . Years of education: Not on file  . Highest education level: Not on file  Occupational  History  . Occupation: owns Automotive engineer  Social Needs  . Financial resource strain: Not on file  . Food insecurity    Worry: Not on file    Inability: Not on file  . Transportation needs    Medical: Not on file    Non-medical: Not on file  Tobacco Use  . Smoking status: Never Smoker  . Smokeless tobacco: Never Used  Substance and Sexual Activity  . Alcohol use: No  . Drug use: No  . Sexual activity: Yes  Lifestyle  . Physical activity    Days per week: Not on file    Minutes per session: Not on file  . Stress: Not on file  Relationships  . Social Herbalist on phone: Not on file    Gets together: Not on file    Attends religious service: Not on file    Active member of club or organization: Not on file    Attends meetings of clubs or organizations: Not on file    Relationship status: Not on file  . Intimate partner violence    Fear of current or ex partner: Not on file    Emotionally abused: Not on file    Physically  abused: Not on file    Forced sexual activity: Not on file  Other Topics Concern  . Not on file  Social History Narrative  . Not on file   Review of Systems  Has lost a few pounds Generally sleeps okay No residual problems with the COVID Does get cough at times---seems to be from "constant hocker" in throat (happens with exposure to cold or dust) Uses the loratadine every day No wheezing or SOB No reflux or heartburn     Objective:   Physical Exam  HENT:  Mouth/Throat: Oropharynx is clear and moist. No oropharyngeal exudate.  Neck: No thyromegaly present.  Cardiovascular: Normal rate, regular rhythm, normal heart sounds and intact distal pulses. Exam reveals no gallop.  No murmur heard. Respiratory: Effort normal and breath sounds normal. No respiratory distress. He has no wheezes. He has no rales.  GI: Soft. There is no abdominal tenderness.  Musculoskeletal:        General: No tenderness or edema.   Lymphadenopathy:    He has no cervical adenopathy.  Neurological:  Normal sensation in feet  Skin:  No foot lesions  Psychiatric: He has a normal mood and affect. His behavior is normal.           Assessment & Plan:

## 2019-08-07 NOTE — Assessment & Plan Note (Signed)
Lab Results  Component Value Date   HGBA1C 8.0 (A) 08/07/2019   Better with lifestyle Never took the liraglutide No new meds for now

## 2019-08-07 NOTE — Assessment & Plan Note (Signed)
Chronic in throat Discussed at length--first thing would be to go to ENT for evaluation (doesn't seem to have regular reflux)

## 2020-01-25 ENCOUNTER — Other Ambulatory Visit: Payer: Self-pay | Admitting: Internal Medicine

## 2020-02-03 ENCOUNTER — Other Ambulatory Visit: Payer: Self-pay | Admitting: Internal Medicine

## 2020-02-09 ENCOUNTER — Encounter: Payer: BC Managed Care – PPO | Admitting: Internal Medicine

## 2020-03-20 IMAGING — DX DG CHEST 1V
1 series · 1 of 1 positions shown · non-contrast
Comparison: 06/29/2010

CLINICAL DATA: Pt c/o having a cough with increased SOB and severe
HA since [REDACTED] and was called today with a positive covid test. Pt
is going into coughing fits in triage. Nonsmoker. Hx of DM.

EXAM:
CHEST  1 VIEW

[chest ap]
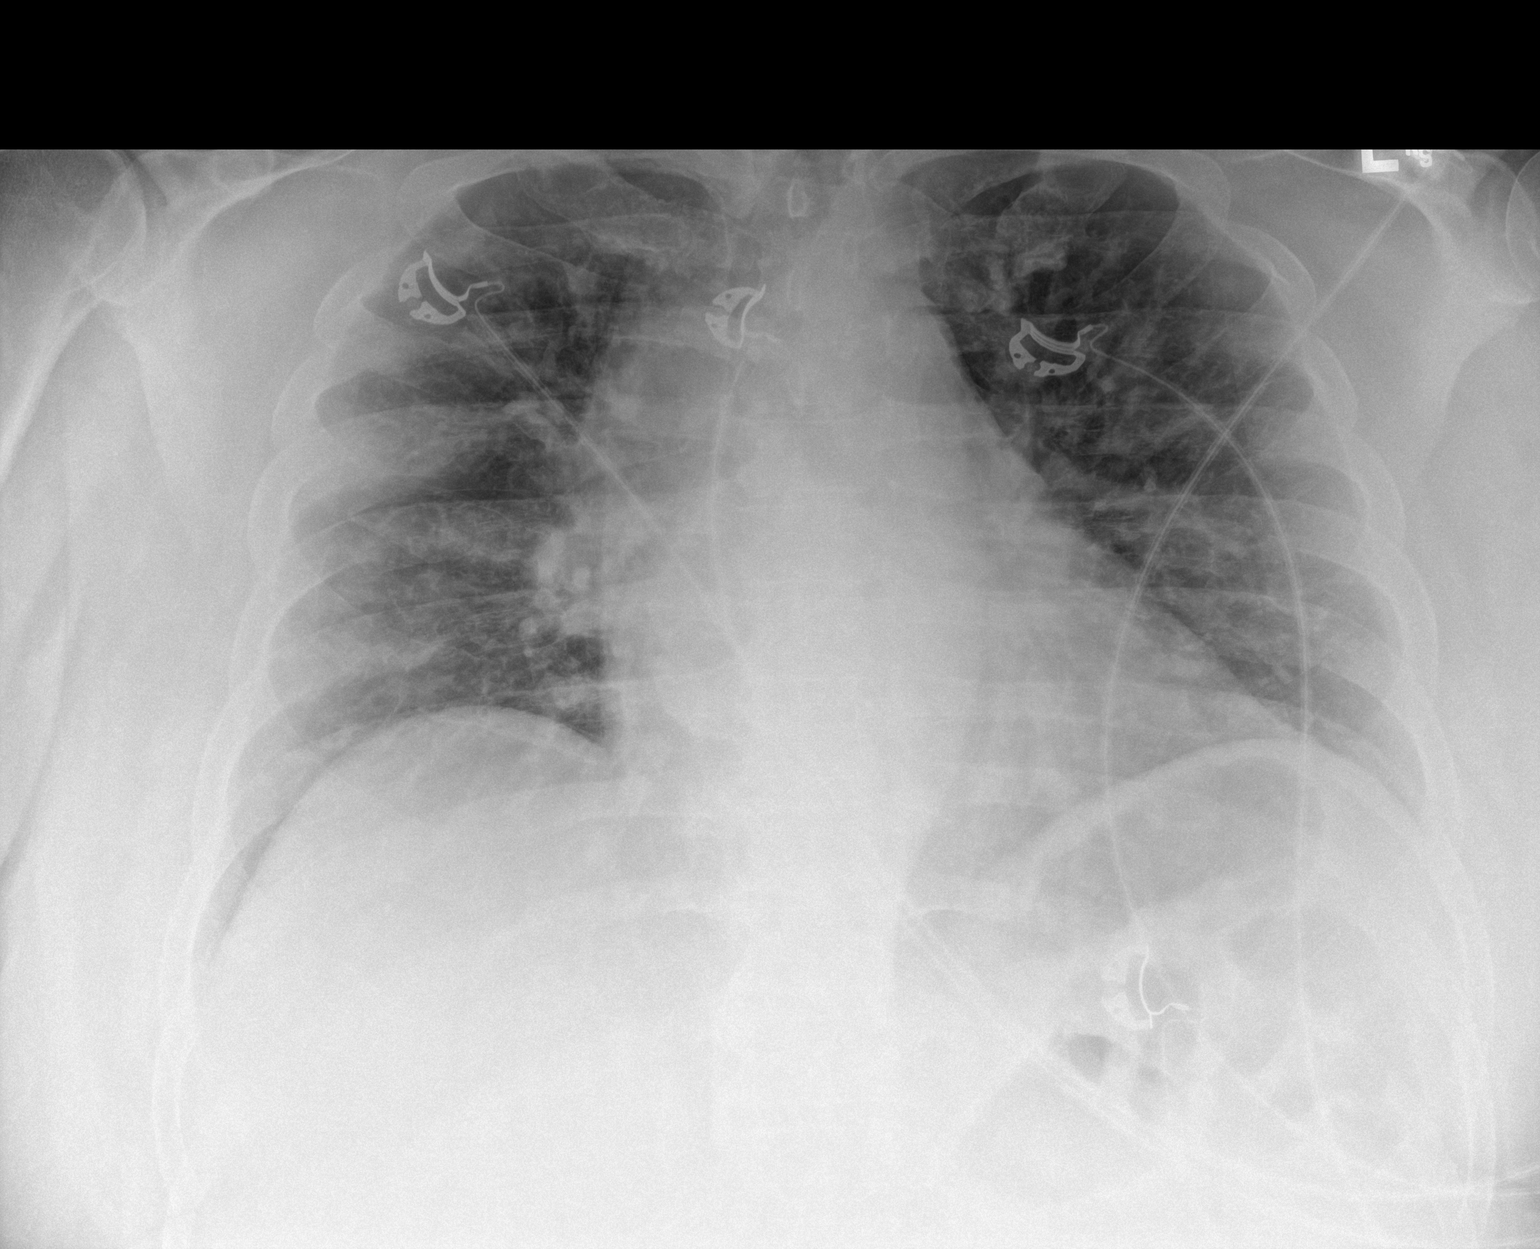

[1 of 1 positions shown; findings below may reference images not displayed]

FINDINGS: Shallow lung inflation. Heart size is normal. There are faint patchy
opacities throughout the lungs bilaterally, without confluent
consolidation. No evidence for pulmonary edema.
IMPRESSION: Bilateral pulmonary infiltrates.

## 2020-03-29 DIAGNOSIS — M9902 Segmental and somatic dysfunction of thoracic region: Secondary | ICD-10-CM | POA: Diagnosis not present

## 2020-03-29 DIAGNOSIS — M5414 Radiculopathy, thoracic region: Secondary | ICD-10-CM | POA: Diagnosis not present

## 2020-03-29 DIAGNOSIS — M546 Pain in thoracic spine: Secondary | ICD-10-CM | POA: Diagnosis not present

## 2020-04-05 DIAGNOSIS — M546 Pain in thoracic spine: Secondary | ICD-10-CM | POA: Diagnosis not present

## 2020-04-05 DIAGNOSIS — M9902 Segmental and somatic dysfunction of thoracic region: Secondary | ICD-10-CM | POA: Diagnosis not present

## 2020-04-05 DIAGNOSIS — M5414 Radiculopathy, thoracic region: Secondary | ICD-10-CM | POA: Diagnosis not present

## 2020-04-07 DIAGNOSIS — M9902 Segmental and somatic dysfunction of thoracic region: Secondary | ICD-10-CM | POA: Diagnosis not present

## 2020-04-07 DIAGNOSIS — M5414 Radiculopathy, thoracic region: Secondary | ICD-10-CM | POA: Diagnosis not present

## 2020-04-07 DIAGNOSIS — M546 Pain in thoracic spine: Secondary | ICD-10-CM | POA: Diagnosis not present

## 2020-04-11 DIAGNOSIS — M546 Pain in thoracic spine: Secondary | ICD-10-CM | POA: Diagnosis not present

## 2020-04-11 DIAGNOSIS — M9902 Segmental and somatic dysfunction of thoracic region: Secondary | ICD-10-CM | POA: Diagnosis not present

## 2020-04-11 DIAGNOSIS — M5414 Radiculopathy, thoracic region: Secondary | ICD-10-CM | POA: Diagnosis not present

## 2020-04-18 DIAGNOSIS — M9902 Segmental and somatic dysfunction of thoracic region: Secondary | ICD-10-CM | POA: Diagnosis not present

## 2020-04-18 DIAGNOSIS — M546 Pain in thoracic spine: Secondary | ICD-10-CM | POA: Diagnosis not present

## 2020-04-18 DIAGNOSIS — M5414 Radiculopathy, thoracic region: Secondary | ICD-10-CM | POA: Diagnosis not present

## 2020-04-28 DIAGNOSIS — M5414 Radiculopathy, thoracic region: Secondary | ICD-10-CM | POA: Diagnosis not present

## 2020-04-28 DIAGNOSIS — M546 Pain in thoracic spine: Secondary | ICD-10-CM | POA: Diagnosis not present

## 2020-04-28 DIAGNOSIS — M9902 Segmental and somatic dysfunction of thoracic region: Secondary | ICD-10-CM | POA: Diagnosis not present

## 2020-05-03 ENCOUNTER — Other Ambulatory Visit: Payer: Self-pay | Admitting: Internal Medicine

## 2020-05-05 DIAGNOSIS — M546 Pain in thoracic spine: Secondary | ICD-10-CM | POA: Diagnosis not present

## 2020-05-05 DIAGNOSIS — M9902 Segmental and somatic dysfunction of thoracic region: Secondary | ICD-10-CM | POA: Diagnosis not present

## 2020-05-05 DIAGNOSIS — M5414 Radiculopathy, thoracic region: Secondary | ICD-10-CM | POA: Diagnosis not present

## 2020-05-16 ENCOUNTER — Encounter: Payer: Self-pay | Admitting: Internal Medicine

## 2020-06-14 DIAGNOSIS — M5414 Radiculopathy, thoracic region: Secondary | ICD-10-CM | POA: Diagnosis not present

## 2020-06-14 DIAGNOSIS — M9902 Segmental and somatic dysfunction of thoracic region: Secondary | ICD-10-CM | POA: Diagnosis not present

## 2020-06-14 DIAGNOSIS — M546 Pain in thoracic spine: Secondary | ICD-10-CM | POA: Diagnosis not present

## 2020-06-16 DIAGNOSIS — M5414 Radiculopathy, thoracic region: Secondary | ICD-10-CM | POA: Diagnosis not present

## 2020-06-16 DIAGNOSIS — M546 Pain in thoracic spine: Secondary | ICD-10-CM | POA: Diagnosis not present

## 2020-06-16 DIAGNOSIS — M9902 Segmental and somatic dysfunction of thoracic region: Secondary | ICD-10-CM | POA: Diagnosis not present

## 2020-06-20 DIAGNOSIS — M9902 Segmental and somatic dysfunction of thoracic region: Secondary | ICD-10-CM | POA: Diagnosis not present

## 2020-06-20 DIAGNOSIS — M546 Pain in thoracic spine: Secondary | ICD-10-CM | POA: Diagnosis not present

## 2020-06-20 DIAGNOSIS — M5414 Radiculopathy, thoracic region: Secondary | ICD-10-CM | POA: Diagnosis not present

## 2020-06-23 DIAGNOSIS — M5414 Radiculopathy, thoracic region: Secondary | ICD-10-CM | POA: Diagnosis not present

## 2020-06-23 DIAGNOSIS — M546 Pain in thoracic spine: Secondary | ICD-10-CM | POA: Diagnosis not present

## 2020-06-23 DIAGNOSIS — M9902 Segmental and somatic dysfunction of thoracic region: Secondary | ICD-10-CM | POA: Diagnosis not present

## 2020-06-29 ENCOUNTER — Other Ambulatory Visit: Payer: Self-pay

## 2020-06-29 ENCOUNTER — Ambulatory Visit: Payer: Self-pay | Admitting: Family Medicine

## 2020-06-29 ENCOUNTER — Encounter: Payer: Self-pay | Admitting: Family Medicine

## 2020-06-29 VITALS — BP 136/73 | HR 82 | Temp 98.6°F | Ht 69.0 in | Wt 282.0 lb

## 2020-06-29 DIAGNOSIS — Z024 Encounter for examination for driving license: Secondary | ICD-10-CM | POA: Insufficient documentation

## 2020-06-29 LAB — HEMOGLOBIN A1C: Hemoglobin A1C: 8.2

## 2020-06-29 NOTE — Progress Notes (Signed)
Subjective:    Patient ID: Derrick Kennedy, male    DOB: [birth year], 59 y.o.   MRN: [id number]  Derrick Kennedy is a 59 y.o. male presenting on 06/29/2020 for Employment Physical (DOT Physical )   HPI  Mr. Derrick Kennedy presents to clinic for DOT PE  Depression screen [facility] 2/9 02/05/2019  Decreased Interest 0  Down, Depressed, Hopeless 0  PHQ - 2 Score 0    Social History   Tobacco Use  . Smoking status: Never Smoker  . Smokeless tobacco: Never Used  Vaping Use  . Vaping Use: Never used  Substance Use Topics  . Alcohol use: No  . Drug use: No    Review of Systems  Constitutional: Negative.   HENT: Negative.   Eyes: Negative.   Respiratory: Negative.   Cardiovascular: Negative.   Gastrointestinal: Negative.   Endocrine: Negative.   Genitourinary: Negative.   Musculoskeletal: Negative.   Skin: Negative.   Allergic/Immunologic: Negative.   Neurological: Negative.   Hematological: Negative.   Psychiatric/Behavioral: Negative.    Per HPI unless specifically indicated above     Objective:    BP 136/73 (BP Location: Left Arm, Patient Position: Sitting, Cuff Size: Large)   Pulse 82   Temp 98.6 F (37 C) (Oral)   Ht 5\' 9"  (1.753 m)   Wt 282 lb (127.9 kg)   BMI 41.64 kg/m   Wt Readings from Last 3 Encounters:  06/29/20 282 lb (127.9 kg)  08/07/19 278 lb (126.1 kg)  06/07/19 282 lb (127.9 kg)    Physical Exam Vitals reviewed.  Constitutional:      General: He is not in acute distress.    Appearance: Normal appearance. He is well-developed and well-groomed. He is morbidly obese. He is not ill-appearing or toxic-appearing.  HENT:     Head: Normocephalic.     Right Ear: Tympanic membrane, ear canal and external ear normal. There is no impacted cerumen.     Left Ear: Tympanic membrane, ear canal and external ear normal. There is no impacted cerumen.     Nose: Nose normal. No congestion or rhinorrhea.     Mouth/Throat:     Mouth: Mucous membranes are moist.     Pharynx:  Oropharynx is clear. No oropharyngeal exudate or posterior oropharyngeal erythema.  Eyes:     General: Lids are normal. Vision grossly intact. No scleral icterus.       Right eye: No discharge.        Left eye: No discharge.     Extraocular Movements: Extraocular movements intact.     Conjunctiva/sclera: Conjunctivae normal.     Pupils: Pupils are equal, round, and reactive to light.  Cardiovascular:     Rate and Rhythm: Normal rate and regular rhythm.     Pulses: Normal pulses.          Dorsalis pedis pulses are 2+ on the right side and 2+ on the left side.     Heart sounds: Normal heart sounds. No murmur heard.  No friction rub. No gallop.   Pulmonary:     Effort: Pulmonary effort is normal. No respiratory distress.     Breath sounds: Normal breath sounds. No wheezing, rhonchi or rales.  Abdominal:     General: Abdomen is flat. Bowel sounds are normal. There is no distension.     Palpations: Abdomen is soft. There is no mass.     Tenderness: There is no abdominal tenderness. There is no right CVA tenderness, left CVA tenderness,  guarding or rebound.     Hernia: No hernia is present.     Comments: Unable to palpate for hepatomegaly or splenomegaly due to body habitus  Musculoskeletal:        General: Normal range of motion.     Cervical back: Normal range of motion and neck supple. No rigidity or tenderness.     Right lower leg: No edema.     Left lower leg: No edema.     Comments: Normal tone, 5/5 strength BUE & BLE  Feet:     Right foot:     Skin integrity: Skin integrity normal.     Left foot:     Skin integrity: Skin integrity normal.  Lymphadenopathy:     Cervical: No cervical adenopathy.  Skin:    General: Skin is warm and dry.     Capillary Refill: Capillary refill takes less than 2 seconds.  Neurological:     General: No focal deficit present.     Mental Status: He is alert and oriented to person, place, and time.     Cranial Nerves: Cranial nerves are intact. No  cranial nerve deficit.     Sensory: Sensation is intact. No sensory deficit.     Motor: Motor function is intact. No weakness.     Coordination: Coordination is intact. Coordination normal.     Gait: Gait is intact. Gait normal.     Deep Tendon Reflexes: Reflexes are normal and symmetric. Reflexes normal.  Psychiatric:        Attention and Perception: Attention and perception normal.        Mood and Affect: Mood and affect normal.        Speech: Speech normal.        Behavior: Behavior normal. Behavior is cooperative.        Thought Content: Thought content normal.        Cognition and Memory: Cognition and memory normal.        Judgment: Judgment normal.    Results for orders placed or performed in visit on 08/07/19  POCT A1C  Result Value Ref Range   Hemoglobin A1C 8.0 (A) 4.0 - 5.6 %   HbA1c POC (<> result, manual entry)     HbA1c, POC (prediabetic range)     HbA1c, POC (controlled diabetic range)    HM DIABETES FOOT EXAM  Result Value Ref Range   HM Diabetic Foot Exam done       Assessment & Plan:   Problem List Items Addressed This Visit      Other   Encounter for Department of Transportation (DOT) examination for trucking license - Primary    DOT Certificate provided x 1 year  Hearing test: Pass at 15' Vision: 20/25 R, 20/25 L, 20/20 Both Corrected  Urine 1.025, Neg Protein, Neg Glucose, Neg Hematuria  A1C 8.2% STOPBANG: 3/8          No orders of the defined types were placed in this encounter.  Follow up plan: Return in about 1 year (around 06/29/2021) for DOT PE.   Harlin Kennedy, [place] [facility] Nurse Practitioner [facility] 06/29/2020, 8:56 AM

## 2020-06-29 NOTE — Assessment & Plan Note (Signed)
DOT Certificate provided x 1 year  Hearing test: Pass at 15' Vision: 20/25 R, 20/25 L, 20/20 Both Corrected  Urine 1.025, Neg Protein, Neg Glucose, Neg Hematuria  A1C 8.2% STOPBANG: 3/8

## 2020-06-29 NOTE — Patient Instructions (Signed)
DOT Certificate provided x 1 year

## 2020-06-30 DIAGNOSIS — M5414 Radiculopathy, thoracic region: Secondary | ICD-10-CM | POA: Diagnosis not present

## 2020-06-30 DIAGNOSIS — M546 Pain in thoracic spine: Secondary | ICD-10-CM | POA: Diagnosis not present

## 2020-06-30 DIAGNOSIS — M9902 Segmental and somatic dysfunction of thoracic region: Secondary | ICD-10-CM | POA: Diagnosis not present

## 2020-07-01 ENCOUNTER — Ambulatory Visit: Payer: Self-pay | Admitting: Internal Medicine

## 2020-07-26 ENCOUNTER — Ambulatory Visit (INDEPENDENT_AMBULATORY_CARE_PROVIDER_SITE_OTHER): Payer: BLUE CROSS/BLUE SHIELD | Admitting: Internal Medicine

## 2020-07-26 ENCOUNTER — Other Ambulatory Visit: Payer: Self-pay

## 2020-07-26 ENCOUNTER — Encounter: Payer: Self-pay | Admitting: Internal Medicine

## 2020-07-26 DIAGNOSIS — E1169 Type 2 diabetes mellitus with other specified complication: Secondary | ICD-10-CM | POA: Diagnosis not present

## 2020-07-26 MED ORDER — LIRAGLUTIDE 18 MG/3ML ~~LOC~~ SOPN: 1.8000 mg | PEN_INJECTOR | Freq: Every day | SUBCUTANEOUS | 11 refills | Status: DC

## 2020-07-26 MED ORDER — ROSUVASTATIN CALCIUM 20 MG PO TABS: 20.0000 mg | ORAL_TABLET | ORAL | 3 refills | Status: DC

## 2020-07-26 NOTE — Assessment & Plan Note (Signed)
Still not acceptable control Urged him to try the liraglutide--in addition to the glipizide and metormin He agrees to start weekly statin as well Complicated with obesity

## 2020-07-26 NOTE — Assessment & Plan Note (Signed)
BMI still over 40 Will start the liraglutide

## 2020-07-26 NOTE — Progress Notes (Signed)
Subjective:    Patient ID: Derrick Kennedy, male    DOB: [birth year], 59 y.o.   MRN: [id number]  HPI Here for follow up of diabetes This visit occurred during the SARS-CoV-2 public health emergency.  Safety protocols were in place, including screening questions prior to the visit, additional usage of staff PPE, and extensive cleaning of exam room while observing appropriate contact time as indicated for disinfecting solutions.   Is consistent with diabetes medications Had posterior rib injury on right---wasn't monitoring sugars/couldn't sleep Some celebrations---not on target with lifestyle AM now 140-165 110-175 during the day No foot tingling or pain  Current Outpatient Medications on File Prior to Visit  Medication Sig Dispense Refill  . glipiZIDE (GLUCOTROL) 5 MG tablet TAKE 1 TABLET(5 MG) BY MOUTH DAILY BEFORE BREAKFAST 90 tablet 3  . loratadine (CLARITIN) 10 MG tablet Take 10 mg by mouth as needed for allergies.    . metFORMIN (GLUCOPHAGE-XR) 750 MG 24 hr tablet TAKE 1 TABLET BY MOUTH THREE TIMES DAILY 270 tablet 3   No current facility-administered medications on file prior to visit.    No Known Allergies  Past Medical History:  Diagnosis Date  . Allergic rhinitis due to pollen   . Bilateral carpal tunnel syndrome   . Type II or unspecified type diabetes mellitus without mention of complication, not stated as uncontrolled     Past Surgical History:  Procedure Laterality Date  . APPENDECTOMY  1976  . CARPAL TUNNEL RELEASE Bilateral 11/24/13   Dr Margaretmary Eddy  . COLONOSCOPY  10/05/2013  . FOOT SURGERY  2004   right heel reconstruction  . POLYPECTOMY      Family History  Problem Relation Age of Onset  . Diabetes Brother   . Kidney failure Brother   . Cancer Neg Hx   . Colon cancer Neg Hx   . Esophageal cancer Neg Hx   . Rectal cancer Neg Hx   . Stomach cancer Neg Hx   . Colon polyps Neg Hx     Social History   Socioeconomic History  . Marital status:  Married    Spouse name: Not on file  . Number of children: 3  . Years of education: Not on file  . Highest education level: Not on file  Occupational History  . Occupation: owns Automotive engineer  Tobacco Use  . Smoking status: Never Smoker  . Smokeless tobacco: Never Used  Vaping Use  . Vaping Use: Never used  Substance and Sexual Activity  . Alcohol use: No  . Drug use: No  . Sexual activity: Yes  Other Topics Concern  . Not on file  Social History Narrative  . Not on file   Social Determinants of Health   Financial Resource Strain:   . Difficulty of Paying Living Expenses: Not on file  Food Insecurity:   . Worried About Charity fundraiser in the Last Year: Not on file  . Ran Out of Food in the Last Year: Not on file  Transportation Needs:   . Lack of Transportation (Medical): Not on file  . Lack of Transportation (Non-Medical): Not on file  Physical Activity:   . Days of Exercise per Week: Not on file  . Minutes of Exercise per Session: Not on file  Stress:   . Feeling of Stress : Not on file  Social Connections:   . Frequency of Communication with Friends and Family: Not on file  . Frequency of Social Gatherings with Friends  and Family: Not on file  . Attends Religious Services: Not on file  . Active Member of Clubs or Organizations: Not on file  . Attends Archivist Meetings: Not on file  . Marital Status: Not on file  Intimate Partner Violence:   . Fear of Current or Ex-Partner: Not on file  . Emotionally Abused: Not on file  . Physically Abused: Not on file  . Sexually Abused: Not on file   Review of Systems  Weight is about the same Rib pain is improving No chest pain or SOB     Objective:   Physical Exam Constitutional:      Appearance: Normal appearance.  Cardiovascular:     Rate and Rhythm: Normal rate and regular rhythm.     Pulses: Normal pulses.     Heart sounds: No murmur heard.  No gallop.   Pulmonary:      Effort: Pulmonary effort is normal.     Breath sounds: Normal breath sounds. No wheezing or rales.  Musculoskeletal:     Cervical back: Neck supple.     Right lower leg: No edema.     Left lower leg: No edema.  Lymphadenopathy:     Cervical: No cervical adenopathy.  Skin:    Comments: No foot lesions  Neurological:     Mental Status: He is alert.            Assessment & Plan:

## 2020-07-26 NOTE — Patient Instructions (Signed)
Please start the liraglutide with 0.6mg  (0.55ml). if you have no problems after a week, increase to 1.2 mg. Then continue to increase to 1.8 mg over another week or so, as long as you don't have any problems with it.

## 2020-09-06 ENCOUNTER — Ambulatory Visit: Payer: Self-pay | Admitting: Internal Medicine

## 2020-10-12 ENCOUNTER — Ambulatory Visit: Payer: BC Managed Care – PPO | Admitting: Internal Medicine

## 2020-10-12 ENCOUNTER — Other Ambulatory Visit: Payer: Self-pay

## 2020-10-12 ENCOUNTER — Telehealth: Payer: Self-pay | Admitting: Internal Medicine

## 2020-10-12 DIAGNOSIS — Z0289 Encounter for other administrative examinations: Secondary | ICD-10-CM

## 2020-10-12 NOTE — Telephone Encounter (Signed)
Patient needs a refill of   - liraglutide (VICTOZA) 18 MG/3ML SOPN - Needles (One Touch) - Strips (One Touch)   Preferred Pharmacy:  [facility] [place], [place] - [address] AT [address] Phone:  [phone]  Fax:  [phone]     Patient was scheduled for an appointment 1.12.22 but left.

## 2020-10-13 MED ORDER — INSULIN PEN NEEDLE 31G X 8 MM MISC: 1.0000 | Freq: Every day | 3 refills | Status: DC

## 2020-10-13 NOTE — Telephone Encounter (Signed)
Spoke to pt. Advised him he has refills of Victoza. Asked him if he needs Pen needles or Lancets. He said pen needles. Also, he needs test strips but will have to get back with me to let me know exactly what glucometer he has so I can send in the correct test strips.

## 2021-01-03 DIAGNOSIS — M5414 Radiculopathy, thoracic region: Secondary | ICD-10-CM | POA: Diagnosis not present

## 2021-01-03 DIAGNOSIS — M5442 Lumbago with sciatica, left side: Secondary | ICD-10-CM | POA: Diagnosis not present

## 2021-01-03 DIAGNOSIS — M9902 Segmental and somatic dysfunction of thoracic region: Secondary | ICD-10-CM | POA: Diagnosis not present

## 2021-01-03 DIAGNOSIS — M9903 Segmental and somatic dysfunction of lumbar region: Secondary | ICD-10-CM | POA: Diagnosis not present

## 2021-01-05 DIAGNOSIS — M5442 Lumbago with sciatica, left side: Secondary | ICD-10-CM | POA: Diagnosis not present

## 2021-01-05 DIAGNOSIS — M9903 Segmental and somatic dysfunction of lumbar region: Secondary | ICD-10-CM | POA: Diagnosis not present

## 2021-01-05 DIAGNOSIS — M9902 Segmental and somatic dysfunction of thoracic region: Secondary | ICD-10-CM | POA: Diagnosis not present

## 2021-01-05 DIAGNOSIS — M5414 Radiculopathy, thoracic region: Secondary | ICD-10-CM | POA: Diagnosis not present

## 2021-01-17 DIAGNOSIS — M5442 Lumbago with sciatica, left side: Secondary | ICD-10-CM | POA: Diagnosis not present

## 2021-01-17 DIAGNOSIS — M9903 Segmental and somatic dysfunction of lumbar region: Secondary | ICD-10-CM | POA: Diagnosis not present

## 2021-01-17 DIAGNOSIS — M5414 Radiculopathy, thoracic region: Secondary | ICD-10-CM | POA: Diagnosis not present

## 2021-01-17 DIAGNOSIS — M9902 Segmental and somatic dysfunction of thoracic region: Secondary | ICD-10-CM | POA: Diagnosis not present

## 2021-01-19 DIAGNOSIS — M5442 Lumbago with sciatica, left side: Secondary | ICD-10-CM | POA: Diagnosis not present

## 2021-01-19 DIAGNOSIS — M9903 Segmental and somatic dysfunction of lumbar region: Secondary | ICD-10-CM | POA: Diagnosis not present

## 2021-01-19 DIAGNOSIS — M5414 Radiculopathy, thoracic region: Secondary | ICD-10-CM | POA: Diagnosis not present

## 2021-01-19 DIAGNOSIS — M9902 Segmental and somatic dysfunction of thoracic region: Secondary | ICD-10-CM | POA: Diagnosis not present

## 2021-01-23 DIAGNOSIS — M9902 Segmental and somatic dysfunction of thoracic region: Secondary | ICD-10-CM | POA: Diagnosis not present

## 2021-01-23 DIAGNOSIS — M5442 Lumbago with sciatica, left side: Secondary | ICD-10-CM | POA: Diagnosis not present

## 2021-01-23 DIAGNOSIS — M5414 Radiculopathy, thoracic region: Secondary | ICD-10-CM | POA: Diagnosis not present

## 2021-01-23 DIAGNOSIS — M9903 Segmental and somatic dysfunction of lumbar region: Secondary | ICD-10-CM | POA: Diagnosis not present

## 2021-01-26 DIAGNOSIS — M5414 Radiculopathy, thoracic region: Secondary | ICD-10-CM | POA: Diagnosis not present

## 2021-01-26 DIAGNOSIS — M9902 Segmental and somatic dysfunction of thoracic region: Secondary | ICD-10-CM | POA: Diagnosis not present

## 2021-01-26 DIAGNOSIS — M5442 Lumbago with sciatica, left side: Secondary | ICD-10-CM | POA: Diagnosis not present

## 2021-01-26 DIAGNOSIS — M9903 Segmental and somatic dysfunction of lumbar region: Secondary | ICD-10-CM | POA: Diagnosis not present

## 2021-01-28 ENCOUNTER — Other Ambulatory Visit: Payer: Self-pay | Admitting: Internal Medicine

## 2021-01-30 ENCOUNTER — Other Ambulatory Visit: Payer: Self-pay | Admitting: Internal Medicine

## 2021-01-30 DIAGNOSIS — M5414 Radiculopathy, thoracic region: Secondary | ICD-10-CM | POA: Diagnosis not present

## 2021-01-30 DIAGNOSIS — M9903 Segmental and somatic dysfunction of lumbar region: Secondary | ICD-10-CM | POA: Diagnosis not present

## 2021-01-30 DIAGNOSIS — M5442 Lumbago with sciatica, left side: Secondary | ICD-10-CM | POA: Diagnosis not present

## 2021-01-30 DIAGNOSIS — M9902 Segmental and somatic dysfunction of thoracic region: Secondary | ICD-10-CM | POA: Diagnosis not present

## 2021-02-09 DIAGNOSIS — M9903 Segmental and somatic dysfunction of lumbar region: Secondary | ICD-10-CM | POA: Diagnosis not present

## 2021-02-09 DIAGNOSIS — M5414 Radiculopathy, thoracic region: Secondary | ICD-10-CM | POA: Diagnosis not present

## 2021-02-09 DIAGNOSIS — M5442 Lumbago with sciatica, left side: Secondary | ICD-10-CM | POA: Diagnosis not present

## 2021-02-09 DIAGNOSIS — M9902 Segmental and somatic dysfunction of thoracic region: Secondary | ICD-10-CM | POA: Diagnosis not present

## 2021-02-13 DIAGNOSIS — M5414 Radiculopathy, thoracic region: Secondary | ICD-10-CM | POA: Diagnosis not present

## 2021-02-13 DIAGNOSIS — M5442 Lumbago with sciatica, left side: Secondary | ICD-10-CM | POA: Diagnosis not present

## 2021-02-13 DIAGNOSIS — M9903 Segmental and somatic dysfunction of lumbar region: Secondary | ICD-10-CM | POA: Diagnosis not present

## 2021-02-13 DIAGNOSIS — M9902 Segmental and somatic dysfunction of thoracic region: Secondary | ICD-10-CM | POA: Diagnosis not present

## 2021-02-27 ENCOUNTER — Other Ambulatory Visit: Payer: Self-pay | Admitting: Internal Medicine

## 2021-02-28 ENCOUNTER — Other Ambulatory Visit: Payer: Self-pay | Admitting: Internal Medicine

## 2021-03-02 DIAGNOSIS — M5442 Lumbago with sciatica, left side: Secondary | ICD-10-CM | POA: Diagnosis not present

## 2021-03-02 DIAGNOSIS — M9903 Segmental and somatic dysfunction of lumbar region: Secondary | ICD-10-CM | POA: Diagnosis not present

## 2021-03-02 DIAGNOSIS — M9902 Segmental and somatic dysfunction of thoracic region: Secondary | ICD-10-CM | POA: Diagnosis not present

## 2021-03-02 DIAGNOSIS — M5414 Radiculopathy, thoracic region: Secondary | ICD-10-CM | POA: Diagnosis not present

## 2021-03-09 DIAGNOSIS — H524 Presbyopia: Secondary | ICD-10-CM | POA: Diagnosis not present

## 2021-03-09 DIAGNOSIS — E113291 Type 2 diabetes mellitus with mild nonproliferative diabetic retinopathy without macular edema, right eye: Secondary | ICD-10-CM | POA: Diagnosis not present

## 2021-03-09 DIAGNOSIS — E083291 Diabetes mellitus due to underlying condition with mild nonproliferative diabetic retinopathy without macular edema, right eye: Secondary | ICD-10-CM | POA: Diagnosis not present

## 2021-03-09 DIAGNOSIS — H2513 Age-related nuclear cataract, bilateral: Secondary | ICD-10-CM | POA: Diagnosis not present

## 2021-03-17 DIAGNOSIS — M7671 Peroneal tendinitis, right leg: Secondary | ICD-10-CM | POA: Diagnosis not present

## 2021-03-29 ENCOUNTER — Other Ambulatory Visit: Payer: Self-pay | Admitting: Internal Medicine

## 2021-04-28 ENCOUNTER — Other Ambulatory Visit: Payer: Self-pay | Admitting: Internal Medicine

## 2021-05-01 ENCOUNTER — Other Ambulatory Visit: Payer: Self-pay | Admitting: Internal Medicine

## 2021-05-28 ENCOUNTER — Other Ambulatory Visit: Payer: Self-pay | Admitting: Internal Medicine

## 2021-05-29 ENCOUNTER — Other Ambulatory Visit: Payer: Self-pay | Admitting: Internal Medicine

## 2021-05-29 NOTE — Telephone Encounter (Signed)
Pt needs a diabetes follow-up. Has nor been seen since 07-26-20. He No Showed for the 3 month f/u 10-12-20. 15 minutes will be fine. I have given him a 30 day supply of medication.

## 2021-06-21 LAB — HM DIABETES EYE EXAM

## 2021-06-27 ENCOUNTER — Other Ambulatory Visit: Payer: Self-pay | Admitting: Internal Medicine

## 2021-07-21 ENCOUNTER — Encounter: Payer: Self-pay | Admitting: Internal Medicine

## 2021-07-27 ENCOUNTER — Other Ambulatory Visit: Payer: Self-pay | Admitting: Internal Medicine

## 2021-08-26 ENCOUNTER — Other Ambulatory Visit: Payer: Self-pay | Admitting: Internal Medicine

## 2021-08-28 ENCOUNTER — Other Ambulatory Visit: Payer: Self-pay | Admitting: Internal Medicine

## 2021-08-28 ENCOUNTER — Telehealth (INDEPENDENT_AMBULATORY_CARE_PROVIDER_SITE_OTHER): Payer: BC Managed Care – PPO | Admitting: Family Medicine

## 2021-08-28 ENCOUNTER — Other Ambulatory Visit: Payer: Self-pay

## 2021-08-28 ENCOUNTER — Encounter: Payer: Self-pay | Admitting: Family Medicine

## 2021-08-28 ENCOUNTER — Other Ambulatory Visit: Payer: Self-pay | Admitting: Family Medicine

## 2021-08-28 VITALS — BP 138/86 | HR 80 | Temp 97.3°F | Ht 69.0 in | Wt 276.0 lb

## 2021-08-28 DIAGNOSIS — J111 Influenza due to unidentified influenza virus with other respiratory manifestations: Secondary | ICD-10-CM

## 2021-08-28 DIAGNOSIS — E1169 Type 2 diabetes mellitus with other specified complication: Secondary | ICD-10-CM | POA: Diagnosis not present

## 2021-08-28 DIAGNOSIS — U071 COVID-19: Secondary | ICD-10-CM | POA: Diagnosis not present

## 2021-08-28 LAB — POCT INFLUENZA A/B
Influenza A, POC: NEGATIVE
Influenza B, POC: NEGATIVE

## 2021-08-28 LAB — POC COVID19 BINAXNOW: SARS Coronavirus 2 Ag: POSITIVE — AB

## 2021-08-28 MED ORDER — CHERATUSSIN AC 100-10 MG/5ML PO SOLN: 5.0000 mL | Freq: Three times a day (TID) | ORAL | 0 refills | Status: DC | PRN

## 2021-08-28 NOTE — Assessment & Plan Note (Signed)
Overdue for DM f/u - advised he call our office to schedule with PCP.

## 2021-08-28 NOTE — Assessment & Plan Note (Addendum)
ADDENDUM ==> tested positive for COVID, neg for flu.  Outside of window for antiviral.  Reviewed expected course of illness, anticipated course of recovery, as well as red flags to suggest COVID pneumonia and/or to seek urgent in-person care. Reviewed latest CDC isolation/quarantine guidelines.  Encouraged fluids and rest. Reviewed further supportive care measures at home including vit C 500mg  bid, vit D 2000 IU daily, zinc 100mg  daily, tylenol PRN, pepcid 20mg  BID PRN.

## 2021-08-28 NOTE — Progress Notes (Addendum)
Patient ID: Derrick Kennedy, male    DOB: January 04, 1961, 60 y.o.   MRN: [id number]  Virtual visit attempted through [place], a video enabled telemedicine application. Due to national recommendations of social distancing due to COVID-19, a virtual visit is felt to be most appropriate for this patient at this time. Reviewed limitations, risks, security and privacy concerns of performing a virtual visit and the availability of in person appointments. I also reviewed that there may be a patient responsible charge related to this service. The patient agreed to proceed.   Interactive audio and video telecommunications were attempted between myself and Derrick Kennedy, however failed due to patient having technical difficulties (video worked but sound did not work).  We continued and completed visit with audio on phone.   Patient location: home  Provider location: [place] [facility], office  Persons participating in this virtual visit: patient, provider   If any vitals were documented, they were collected by patient at home unless specified below.   BP 138/86   Pulse 80   Temp (!) 97.3 F (36.3 C)   Ht 5\' 9"  (1.753 m)   Wt 276 lb (125.2 kg)   BMI 40.76 kg/m    CC: cough Subjective:   HPI: Derrick Kennedy is a 60 y.o. male presenting on 08/28/2021 for Cough (C/o prod cough with reddish/brown mucous and HA.  Sx started 08/22/21.  Denies any other sxs.  Tried Tylenol Severe Cold/Flu.  X2 neg home COVID tests, 1st on 08/22/21, 2nd on 08/24/21.  No COVID or flu shots. )   6d h/o body aches, productive cough of colored mucous and sinus pressure headache. Ongoing chest congestion and cough. Initially feverish but not since initial illness. No appetite. Some wheezing. Cough limits sleep.   No significant fevers/chills otherwise, ear or tooth pain, abd pain, nausea, diarrhea, ST, dyspnea.  No h/o asthma or smoking.   Tried nyquil, dayquil without benefit. Also tried severe sinus tylenol.   Tessalon perls haven't helped.  No sick contacts at home.   Had 2 negative COVID tests latest 08/24/2021.  Not COVID vaccinated.   Known diabetic on victoza, metformin, glipizide, overdue for DM f/u. Encouraged he call and schedule appt.  Lab Results  Component Value Date   HGBA1C 8.2 06/29/2020       Relevant past medical, surgical, family and social history reviewed and updated as indicated. Interim medical history since our last visit reviewed. Allergies and medications reviewed and updated. Outpatient Medications Prior to Visit  Medication Sig Dispense Refill   glipiZIDE (GLUCOTROL) 5 MG tablet TAKE 1 TABLET(5 MG) BY MOUTH DAILY BEFORE AND BREAKFAST 30 tablet 0   Insulin Pen Needle 31G X 8 MM MISC 1 each by Does not apply route daily. 100 each 3   liraglutide (VICTOZA) 18 MG/3ML SOPN Inject 1.8 mg into the skin daily. Titrate up as discussed 9 mL 11   loratadine (CLARITIN) 10 MG tablet Take 10 mg by mouth as needed for allergies.     metFORMIN (GLUCOPHAGE-XR) 750 MG 24 hr tablet TAKE 1 TABLET BY MOUTH THREE TIMES DAILY 60 tablet 0   rosuvastatin (CRESTOR) 20 MG tablet Take 1 tablet (20 mg total) by mouth once a week. 13 tablet 3   No facility-administered medications prior to visit.     Per HPI unless specifically indicated in ROS section below Review of Systems Objective:  BP 138/86   Pulse 80   Temp (!) 97.3 F (36.3 C)  Ht 5\' 9"  (1.753 m)   Wt 276 lb (125.2 kg)   BMI 40.76 kg/m   Wt Readings from Last 3 Encounters:  08/28/21 276 lb (125.2 kg)  07/26/20 278 lb (126.1 kg)  06/29/20 282 lb (127.9 kg)       Physical exam: Gen: alert, NAD, not ill appearing Pulm: speaks in complete sentences without increased work of breathing Psych: normal mood, normal thought content      Results for orders placed or performed in visit on 08/28/21  Influenza A/B  Result Value Ref Range   Influenza A, POC Negative Negative   Influenza B, POC Negative Negative  POC  COVID-19  Result Value Ref Range   SARS Coronavirus 2 Ag Positive (A) Negative   Lab Results  Component Value Date   HGBA1C 8.2 06/29/2020    Assessment & Plan:   Problem List Items Addressed This Visit     Type 2 diabetes mellitus with other specified complication ([place])    Overdue for DM f/u - advised he call our office to schedule with PCP.       Morbid obesity ([place])   Influenza-like illness    Anticipate influenza like illness - I have asked him to come in for curbside testing at [facility] this afternoon - will check for flu and COVID. If +flu, low threshold to treat with tamiflu given comorbidities despite outside of window for optimal treatment.  Rx cheratussin for cough at night time. Red flags to suspect bacterial infection and to seek further care or notify us reviewed.       Relevant Orders   Influenza A/B (Completed)   POC COVID-19 (Completed)   COVID-19 virus infection - Primary    ADDENDUM ==> tested positive for COVID, neg for flu.  Outside of window for antiviral.  Reviewed expected course of illness, anticipated course of recovery, as well as red flags to suggest COVID pneumonia and/or to seek urgent in-person care. Reviewed latest CDC isolation/quarantine guidelines.  Encouraged fluids and rest. Reviewed further supportive care measures at home including vit C 500mg  bid, vit D 2000 IU daily, zinc 100mg  daily, tylenol PRN, pepcid 20mg  BID PRN.          Meds ordered this encounter  Medications   guaiFENesin-codeine (CHERATUSSIN AC) 100-10 MG/5ML syrup    Sig: Take 5 mLs by mouth 3 (three) times daily as needed for cough (sedation precautions).    Dispense:  120 mL    Refill:  0    Orders Placed This Encounter  Procedures   Influenza A/B   POC COVID-19    Order Specific Question:   Previously tested for COVID-19    Answer:   Yes    Order Specific Question:   Resident in a congregate (group) care setting    Answer:   No    Order Specific  Question:   Employed in healthcare setting    Answer:   No     I discussed the assessment and treatment plan with the patient. The patient was provided an opportunity to ask questions and all were answered. The patient agreed with the plan and demonstrated an understanding of the instructions. The patient was advised to call back or seek an in-person evaluation if the symptoms worsen or if the condition fails to improve as anticipated.  Follow up plan: No follow-ups on file.  Ria Bush, MD

## 2021-08-28 NOTE — Addendum Note (Signed)
Addended by: Francella Solian on: 08/28/2021 02:20 PM   Modules accepted: Orders

## 2021-08-28 NOTE — Assessment & Plan Note (Signed)
Anticipate influenza like illness - I have asked him to come in for curbside testing at [facility] this afternoon - will check for flu and COVID. If +flu, low threshold to treat with tamiflu given comorbidities despite outside of window for optimal treatment.  Rx cheratussin for cough at night time. Red flags to suspect bacterial infection and to seek further care or notify us reviewed.

## 2021-08-28 NOTE — Progress Notes (Signed)
Ordering tests for curbside labs this afternoon after virtual visit.

## 2021-08-29 ENCOUNTER — Encounter: Payer: Self-pay | Admitting: Family Medicine

## 2021-08-29 ENCOUNTER — Other Ambulatory Visit: Payer: Self-pay | Admitting: Internal Medicine

## 2021-08-29 MED ORDER — HYDROCOD POLST-CPM POLST ER 10-8 MG/5ML PO SUER: 5.0000 mL | Freq: Two times a day (BID) | ORAL | 0 refills | Status: DC | PRN

## 2021-08-29 MED ORDER — METFORMIN HCL ER 750 MG PO TB24: 750.0000 mg | ORAL_TABLET | Freq: Three times a day (TID) | ORAL | 0 refills | Status: DC

## 2021-08-29 NOTE — Telephone Encounter (Signed)
Have him set up a physical within 3 months--or I can't refill his meds

## 2021-09-25 ENCOUNTER — Other Ambulatory Visit: Payer: Self-pay | Admitting: Internal Medicine

## 2021-10-27 ENCOUNTER — Other Ambulatory Visit: Payer: Self-pay | Admitting: Internal Medicine

## 2021-11-30 ENCOUNTER — Telehealth: Payer: Self-pay

## 2021-11-30 NOTE — Telephone Encounter (Signed)
Patient called needed refill on metformin per last refill needs office visit. He will not be in town to be seen until after he is out. He will be out of meds on 3/7 were not able to get in for appointment until 12/20/21. Would like refill sent to last until appointment.  ?[facility] in [place]  ?

## 2021-12-01 MED ORDER — METFORMIN HCL ER 750 MG PO TB24: 2250.0000 mg | ORAL_TABLET | Freq: Every day | ORAL | 0 refills | Status: DC

## 2021-12-01 NOTE — Telephone Encounter (Signed)
Rx sent electronically.  

## 2021-12-01 NOTE — Addendum Note (Signed)
Addended by: Pilar Grammes on: 12/01/2021 10:39 AM ? ? Modules accepted: Orders ? ?

## 2021-12-20 ENCOUNTER — Encounter: Payer: Self-pay | Admitting: Internal Medicine

## 2021-12-20 ENCOUNTER — Other Ambulatory Visit: Payer: Self-pay

## 2021-12-20 ENCOUNTER — Ambulatory Visit: Payer: BC Managed Care – PPO | Admitting: Internal Medicine

## 2021-12-20 VITALS — BP 118/74 | HR 80 | Temp 97.2°F | Ht 69.5 in | Wt 281.0 lb

## 2021-12-20 DIAGNOSIS — E1169 Type 2 diabetes mellitus with other specified complication: Secondary | ICD-10-CM | POA: Diagnosis not present

## 2021-12-20 LAB — HM DIABETES FOOT EXAM

## 2021-12-20 LAB — POCT GLYCOSYLATED HEMOGLOBIN (HGB A1C): Hemoglobin A1C: 9.2 % — AB (ref 4.0–5.6)

## 2021-12-20 MED ORDER — GLIPIZIDE ER 10 MG PO TB24: 10.0000 mg | ORAL_TABLET | Freq: Every day | ORAL | 3 refills | Status: DC

## 2021-12-20 MED ORDER — METFORMIN HCL ER 750 MG PO TB24: 2250.0000 mg | ORAL_TABLET | Freq: Every day | ORAL | 3 refills | Status: DC

## 2021-12-20 MED ORDER — ROSUVASTATIN CALCIUM 20 MG PO TABS: 20.0000 mg | ORAL_TABLET | Freq: Every day | ORAL | 3 refills | Status: DC

## 2021-12-20 MED ORDER — LIRAGLUTIDE 18 MG/3ML ~~LOC~~ SOPN: 1.8000 mg | PEN_INJECTOR | Freq: Every day | SUBCUTANEOUS | 11 refills | Status: DC

## 2021-12-20 NOTE — Progress Notes (Signed)
? ?Subjective:  ? ? Patient ID: Derrick Kennedy, male    DOB: 10/16/1960, 61 y.o.   MRN: [id number] ? ?HPI ?Here for follow up of diabetes ? ?"I feel good" ?Did use the victoza for 4-6 months ?No apparent problems even on 1.'8mg'$ --- then decided to stop "I felt good" ?Ran out of glipizide 10 days ago ?Does take the metformin 750 tid ?Hasn't been checking sugars for a couple of months---had been 190-230 ? ?No sugared drinks ?Does have fast food--trying to control (trouble with traveling) ?No exercise ? ?Current Outpatient Medications on File Prior to Visit  ?Medication Sig Dispense Refill  ? Insulin Pen Needle 31G X 8 MM MISC 1 each by Does not apply route daily. 100 each 3  ? loratadine (CLARITIN) 10 MG tablet Take 10 mg by mouth as needed for allergies.    ? metFORMIN (GLUCOPHAGE-XR) 750 MG 24 hr tablet Take 3 tablets (2,250 mg total) by mouth daily with breakfast. 30 tablet 0  ? glipiZIDE (GLUCOTROL) 5 MG tablet TAKE 1 TABLET(5 MG) BY MOUTH DAILY BEFORE BREAKFAST 30 tablet 0  ? liraglutide (VICTOZA) 18 MG/3ML SOPN Inject 1.8 mg into the skin daily. Titrate up as discussed (Patient not taking: Reported on 12/20/2021) 9 mL 11  ? rosuvastatin (CRESTOR) 20 MG tablet Take 1 tablet (20 mg total) by mouth once a week. (Patient not taking: Reported on 12/20/2021) 13 tablet 3  ? ?No current facility-administered medications on file prior to visit.  ? ? ?Allergies  ?Allergen Reactions  ? Other   ? ? ?Past Medical History:  ?Diagnosis Date  ? Allergic rhinitis due to pollen   ? Bilateral carpal tunnel syndrome   ? Type II or unspecified type diabetes mellitus without mention of complication, not stated as uncontrolled   ? ? ?Past Surgical History:  ?Procedure Laterality Date  ? APPENDECTOMY  1976  ? CARPAL TUNNEL RELEASE Bilateral 11/24/13  ? Dr Margaretmary Eddy  ? COLONOSCOPY  10/05/2013  ? FOOT SURGERY  2004  ? right heel reconstruction  ? POLYPECTOMY    ? ? ?Family History  ?Problem Relation Age of Onset  ? Diabetes Brother   ? Kidney  failure Brother   ? Cancer Neg Hx   ? Colon cancer Neg Hx   ? Esophageal cancer Neg Hx   ? Rectal cancer Neg Hx   ? Stomach cancer Neg Hx   ? Colon polyps Neg Hx   ? ? ?Social History  ? ?Socioeconomic History  ? Marital status: Married  ?  Spouse name: Not on file  ? Number of children: 3  ? Years of education: Not on file  ? Highest education level: Not on file  ?Occupational History  ? Occupation: owns Automotive engineer  ?Tobacco Use  ? Smoking status: Never  ?  Passive exposure: Past  ? Smokeless tobacco: Never  ?Vaping Use  ? Vaping Use: Never used  ?Substance and Sexual Activity  ? Alcohol use: No  ? Drug use: No  ? Sexual activity: Yes  ?Other Topics Concern  ? Not on file  ?Social History Narrative  ? Not on file  ? ?Social Determinants of Health  ? ?Financial Resource Strain: Not on file  ?Food Insecurity: Not on file  ?Transportation Needs: Not on file  ?Physical Activity: Not on file  ?Stress: Not on file  ?Social Connections: Not on file  ?Intimate Partner Violence: Not on file  ? ?Review of Systems ?No objection to statin---but ran out ?  Sleeps okay ?No chest pain or SOB ? ?   ?Objective:  ? Physical Exam ?Constitutional:   ?   Appearance: Normal appearance.  ?Cardiovascular:  ?   Rate and Rhythm: Normal rate and regular rhythm.  ?   Pulses: Normal pulses.  ?   Heart sounds: No murmur heard. ?  No gallop.  ?Pulmonary:  ?   Effort: Pulmonary effort is normal.  ?   Breath sounds: Normal breath sounds. No wheezing or rales.  ?Musculoskeletal:  ?   Cervical back: Neck supple.  ?   Right lower leg: No edema.  ?   Left lower leg: No edema.  ?Lymphadenopathy:  ?   Cervical: No cervical adenopathy.  ?Skin: ?   Comments: No foot lesions  ?Neurological:  ?   Mental Status: He is alert.  ?   Comments: Normal sensation in feet  ?Psychiatric:     ?   Mood and Affect: Mood normal.     ?   Behavior: Behavior normal.  ?  ? ? ? ? ?   ?Assessment & Plan:  ? ?

## 2021-12-20 NOTE — Assessment & Plan Note (Signed)
Lab Results  ?Component Value Date  ? HGBA1C 9.2 (A) 12/20/2021  ? ?Insight and judgement are not great! ?Discussed that if he feels good--this is a sign he is tolerating his meds, not that he should stop them ? ?Will restart victoza--titrate to 1. 8 quickly ?Metformin 750 tid, glipizide XL 10 daily ?

## 2022-01-03 ENCOUNTER — Encounter: Payer: Self-pay | Admitting: Internal Medicine

## 2022-01-03 ENCOUNTER — Ambulatory Visit: Payer: BC Managed Care – PPO | Admitting: Internal Medicine

## 2022-01-03 DIAGNOSIS — R051 Acute cough: Secondary | ICD-10-CM | POA: Diagnosis not present

## 2022-01-03 MED ORDER — HYDROCODONE BIT-HOMATROP MBR 5-1.5 MG/5ML PO SOLN: 5.0000 mL | Freq: Every evening | ORAL | 0 refills | Status: DC | PRN

## 2022-01-03 MED ORDER — BENZONATATE 200 MG PO CAPS: 200.0000 mg | ORAL_CAPSULE | Freq: Three times a day (TID) | ORAL | 2 refills | Status: DC | PRN

## 2022-01-03 NOTE — Assessment & Plan Note (Signed)
After dust exposure ?Has caused persistent cough (for 3 months) in the past ?Will Rx hydrocodone syrup and benzonatate for prn use ?If worsens with more green sputum next week--would Rx doxy '100mg'$  bid x 5-7 days ?

## 2022-01-03 NOTE — Progress Notes (Signed)
? ?Subjective:  ? ? Patient ID: Derrick Kennedy, male    DOB: 1961-08-02, 61 y.o.   MRN: [id number] ? ?HPI ?Here with cough and concern for persistent symptoms ? 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 Some green sputum ?All seems to be bronchial ?No head symptoms ? ?Using some nyquil ? ?Current Outpatient Medications on File Prior to Visit  ?Medication Sig Dispense Refill  ? glipiZIDE (GLUCOTROL XL) 10 MG 24 hr tablet Take 1 tablet (10 mg total) by mouth daily with breakfast. 90 tablet 3  ? Insulin Pen Needle 31G X 8 MM MISC 1 each by Does not apply route daily. 100 each 3  ? liraglutide (VICTOZA) 18 MG/3ML SOPN Inject 1.8 mg into the skin daily. Titrate up as discussed 9 mL 11  ? loratadine (CLARITIN) 10 MG tablet Take 10 mg by mouth as needed for allergies.    ? metFORMIN (GLUCOPHAGE-XR) 750 MG 24 hr tablet Take 3 tablets (2,250 mg total) by mouth daily with breakfast. 270 tablet 3  ? rosuvastatin (CRESTOR) 20 MG tablet Take 1 tablet (20 mg total) by mouth daily. 90 tablet 3  ? ?No current facility-administered medications on file prior to visit.  ? ? ?Allergies  ?Allergen Reactions  ? Other   ? ? ?Past Medical History:  ?Diagnosis Date  ? Allergic rhinitis due to pollen   ? Bilateral carpal tunnel syndrome   ? Type II or unspecified type diabetes mellitus without mention of complication, not stated as uncontrolled   ? ? ?Past Surgical History:  ?Procedure Laterality Date  ? APPENDECTOMY  1976  ? CARPAL TUNNEL RELEASE Bilateral 11/24/13  ? Dr Margaretmary Eddy  ? COLONOSCOPY  10/05/2013  ? FOOT SURGERY  2004  ? right heel reconstruction  ? POLYPECTOMY    ? ? ?Family History  ?Problem Relation Age of Onset  ? Diabetes Brother   ? Kidney failure Brother   ? Cancer Neg Hx   ? Colon  cancer Neg Hx   ? Esophageal cancer Neg Hx   ? Rectal cancer Neg Hx   ? Stomach cancer Neg Hx   ? Colon polyps Neg Hx   ? ? ?Social History  ? ?Socioeconomic History  ? Marital status: Married  ?  Spouse name: Not on file  ? Number of children: 3  ? Years of education: Not on file  ? Highest education level: Not on file  ?Occupational History  ? Occupation: owns Automotive engineer  ?Tobacco Use  ? Smoking status: Never  ?  Passive exposure: Past  ? Smokeless tobacco: Never  ?Vaping Use  ? Vaping Use: Never used  ?Substance and Sexual Activity  ? Alcohol use: No  ? Drug use: No  ? Sexual activity: Yes  ?Other Topics Concern  ? Not on file  ?Social History Narrative  ? Not on file  ? ?Social Determinants of Health  ? ?Financial Resource Strain: Not on file  ?Food Insecurity: Not on file  ?Transportation Needs: Not on file  ?Physical Activity: Not on file  ?Stress: Not on file  ?Social Connections: Not on file  ?Intimate Partner Violence: Not on file  ? ?Review of Systems ?Has had some mild throat symptoms--causing cough ?No fever ?No SOB ?  Sugars have been better---testing one or 2 times a day. Down from 200's to 170 ?Not really pollen sensitive---is careful about grass, etc ?   ?Objective:  ? Physical Exam ?Constitutional:   ?   Comments: Frequent cough---appears well  ?HENT:  ?   Head:  ?   Comments: No sinus tenderness ?   Ears:  ?   Comments: Cerumenosis--part of TMs visible ----normal ?   Nose:  ?   Comments: Mild congestion ?   Mouth/Throat:  ?   Pharynx: No oropharyngeal exudate or posterior oropharyngeal erythema.  ?Pulmonary:  ?   Effort: Pulmonary effort is normal.  ?   Breath sounds: Normal breath sounds. No wheezing or rales.  ?Musculoskeletal:  ?   Cervical back: Neck supple.  ?Lymphadenopathy:  ?   Cervical: No cervical adenopathy.  ?  ? ? ? ? ?   ?Assessment & Plan:  ? ?

## 2022-03-26 ENCOUNTER — Ambulatory Visit (INDEPENDENT_AMBULATORY_CARE_PROVIDER_SITE_OTHER): Payer: BC Managed Care – PPO | Admitting: Internal Medicine

## 2022-03-26 ENCOUNTER — Encounter: Payer: Self-pay | Admitting: Internal Medicine

## 2022-03-26 VITALS — BP 134/80 | HR 88 | Temp 97.8°F | Ht 69.0 in | Wt 260.0 lb

## 2022-03-26 DIAGNOSIS — E1142 Type 2 diabetes mellitus with diabetic polyneuropathy: Secondary | ICD-10-CM

## 2022-03-26 DIAGNOSIS — Z23 Encounter for immunization: Secondary | ICD-10-CM | POA: Diagnosis not present

## 2022-03-26 DIAGNOSIS — Z0000 Encounter for general adult medical examination without abnormal findings: Secondary | ICD-10-CM

## 2022-03-26 DIAGNOSIS — Z125 Encounter for screening for malignant neoplasm of prostate: Secondary | ICD-10-CM | POA: Diagnosis not present

## 2022-03-26 LAB — POCT GLYCOSYLATED HEMOGLOBIN (HGB A1C): Hemoglobin A1C: 6.6 % — AB (ref 4.0–5.6)

## 2022-03-26 NOTE — Assessment & Plan Note (Signed)
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

## 2022-03-26 NOTE — Assessment & Plan Note (Signed)
A1c down to 6.6% back on liraglutide 1.8mg  daily, metformin 750 tid and glipizide 10 Very mild neuropathy in feet Is on statin

## 2022-03-27 LAB — CBC
HCT: 37.7 % — ABNORMAL LOW (ref 39.0–52.0)
Hemoglobin: 12.4 g/dL — ABNORMAL LOW (ref 13.0–17.0)
MCHC: 32.9 g/dL (ref 30.0–36.0)
MCV: 94.1 fl (ref 78.0–100.0)
Platelets: 358 10*3/uL (ref 150.0–400.0)
RBC: 4 Mil/uL — ABNORMAL LOW (ref 4.22–5.81)
RDW: 14.5 % (ref 11.5–15.5)
WBC: 7.9 10*3/uL (ref 4.0–10.5)

## 2022-03-27 LAB — LIPID PANEL
Cholesterol: 84 mg/dL (ref 0–200)
HDL: 30.7 mg/dL — ABNORMAL LOW (ref >39.00)
LDL Cholesterol: 35 mg/dL (ref 0–99)
NonHDL: 53.79
Total CHOL/HDL Ratio: 3
Triglycerides: 92 mg/dL (ref 0.0–149.0)
VLDL: 18.4 mg/dL (ref 0.0–40.0)

## 2022-03-27 LAB — COMPREHENSIVE METABOLIC PANEL
ALT: 43 U/L (ref 0–53)
AST: 33 U/L (ref 0–37)
Albumin: 4.2 g/dL (ref 3.5–5.2)
Alkaline Phosphatase: 33 U/L — ABNORMAL LOW (ref 39–117)
BUN: 24 mg/dL — ABNORMAL HIGH (ref 6–23)
CO2: 27 mEq/L (ref 19–32)
Calcium: 9.9 mg/dL (ref 8.4–10.5)
Chloride: 102 mEq/L (ref 96–112)
Creatinine, Ser: 0.89 mg/dL (ref 0.40–1.50)
GFR: 93.28 mL/min (ref >60.00)
Glucose, Bld: 146 mg/dL — ABNORMAL HIGH (ref 70–99)
Potassium: 4.2 mEq/L (ref 3.5–5.1)
Sodium: 140 mEq/L (ref 135–145)
Total Bilirubin: 0.3 mg/dL (ref 0.2–1.2)
Total Protein: 7.5 g/dL (ref 6.0–8.3)

## 2022-03-27 LAB — MICROALBUMIN / CREATININE URINE RATIO
Creatinine,U: 144 mg/dL
Microalb Creat Ratio: 1.4 mg/g (ref 0.0–30.0)
Microalb, Ur: 1.9 mg/dL (ref 0.0–1.9)

## 2022-03-27 LAB — PSA: PSA: 0.67 ng/mL (ref 0.10–4.00)

## 2022-08-08 ENCOUNTER — Telehealth: Payer: Self-pay

## 2022-08-08 NOTE — Telephone Encounter (Signed)
[facility] Night - Client Nonclinical Telephone Record  [facility] Client [facility] Night - Client Client Site [place] - Night Provider Viviana Simpler- MD Contact Type Call Who Is Calling Patient / Member / Family / Caregiver Caller Name Derrick Kennedy Caller Phone Number [phone] Patient Name Derrick Kennedy Patient DOB 02/08/1961 Call Type Message Only Information Provided Reason for Call Request for General Office Information Initial Comment Caller states his insurance wants to know if he can use a different medication other than Victoza? Disp. Time Disposition Final User 08/07/2022 5:00:41 PM General Information Provided Yes Kathlynn Grate Call Closed By: Kathlynn Grate Transaction Date/Time: 08/07/2022 4:58:32 PM (ET

## 2022-08-08 NOTE — Telephone Encounter (Signed)
Left message on verified VM asking pt what is meant by this. Are they saying he needs a prior auth done? Did they say what they would rather cover?

## 2022-08-13 ENCOUNTER — Encounter: Payer: Self-pay | Admitting: Internal Medicine

## 2022-08-13 MED ORDER — TIRZEPATIDE 7.5 MG/0.5ML ~~LOC~~ SOAJ: 7.5000 mg | SUBCUTANEOUS | 3 refills | Status: DC

## 2022-08-13 NOTE — Telephone Encounter (Signed)
See MyChart message about Victoza.

## 2022-09-25 ENCOUNTER — Ambulatory Visit: Payer: BC Managed Care – PPO | Admitting: Internal Medicine

## 2022-11-22 ENCOUNTER — Ambulatory Visit: Payer: BC Managed Care – PPO | Admitting: Internal Medicine

## 2022-12-08 ENCOUNTER — Other Ambulatory Visit: Payer: Self-pay | Admitting: Internal Medicine

## 2023-01-02 ENCOUNTER — Other Ambulatory Visit: Payer: Self-pay | Admitting: Internal Medicine

## 2023-01-14 ENCOUNTER — Ambulatory Visit: Payer: BC Managed Care – PPO | Admitting: Internal Medicine

## 2023-01-17 ENCOUNTER — Encounter: Payer: Self-pay | Admitting: Internal Medicine

## 2023-01-17 ENCOUNTER — Ambulatory Visit: Payer: BC Managed Care – PPO | Admitting: Internal Medicine

## 2023-01-17 VITALS — BP 136/80 | HR 72 | Temp 97.8°F | Ht 69.0 in | Wt 278.0 lb

## 2023-01-17 DIAGNOSIS — M25562 Pain in left knee: Secondary | ICD-10-CM | POA: Diagnosis not present

## 2023-01-17 DIAGNOSIS — E1142 Type 2 diabetes mellitus with diabetic polyneuropathy: Secondary | ICD-10-CM | POA: Diagnosis not present

## 2023-01-17 DIAGNOSIS — M25561 Pain in right knee: Secondary | ICD-10-CM | POA: Diagnosis not present

## 2023-01-17 LAB — POCT GLYCOSYLATED HEMOGLOBIN (HGB A1C): Hemoglobin A1C: 8.1 % — AB (ref 4.0–5.6)

## 2023-01-17 MED ORDER — TIRZEPATIDE 7.5 MG/0.5ML ~~LOC~~ SOAJ: 7.5000 mg | SUBCUTANEOUS | 3 refills | Status: DC

## 2023-01-17 NOTE — Assessment & Plan Note (Signed)
Despite it being burning--doesn't seem to be neuropathy I suspect mechanical pain and since pain is medial on both sides--may be related to overpronation evident Asked him to get arch support insetrts---if pain not improved in a few weeks, would set up with Dr Patsy Lager for evaluation

## 2023-01-17 NOTE — Assessment & Plan Note (Signed)
Lab Results  Component Value Date   HGBA1C 8.1 (A) 01/17/2023   Hadn't started the mounjaro till a few weeks ago Is on glipizide , metformin  daily Will wait 3 months--since dental infections cleared and needs longer on this dose

## 2023-01-17 NOTE — Patient Instructions (Signed)
Please get arch support inserts for your boots/shoes. If your knee pain is not better in 2-3 weeks---set up an appointment with Dr Patsy Lager (our sports medicine expert)  Try senna-s 2 tabs daily to help the constipation.

## 2023-01-17 NOTE — Progress Notes (Signed)
Subjective:    Patient ID: Derrick Kennedy, male    DOB: [birth year], 62 y.o.   MRN: [id number]  HPI Here for follow up of diabetes and other chronic health conditions  Having trouble with sugars Multiple dental infections---the last 3 months "have been brutal" Sugars highly labile--- can go from 140-300 Mouth finally back to normal  Switched to mounjaro--due to insurance Didn't start it right away---because numbers were okay Only just started it about 5 weeks ago Some constipation since starting--may go 3 days without, then will finally go  Has noted at night -- "my knees kill me" Feel on fire at night Ibuprofen  only helped briefly If on feet a lot--will get some foot numbness No set exercise--but walks at work and climbs ladders, etc  Current Outpatient Medications on File Prior to Visit  Medication Sig Dispense Refill   glipiZIDE (GLUCOTROL XL) 10 MG 24 hr tablet TAKE 1 TABLET(10 MG) BY MOUTH DAILY WITH BREAKFAST 90 tablet 0   Insulin Pen Needle 31G X 8 MM MISC 1 each by Does not apply route daily. 100 each 3   loratadine (CLARITIN) 10 MG tablet Take 10 mg by mouth as needed for allergies.     metFORMIN (GLUCOPHAGE-XR) 750 MG 24 hr tablet TAKE 3 TABLETS(2250 MG) BY MOUTH DAILY WITH BREAKFAST 270 tablet 0   rosuvastatin (CRESTOR) 20 MG tablet Take 1 tablet (20 mg total) by mouth daily. 90 tablet 3   tirzepatide (MOUNJARO) 7.5 MG/0.5ML Pen Inject 7.5 mg into the skin once a week. 2 mL 3   No current facility-administered medications on file prior to visit.    Allergies  Allergen Reactions   Other     Past Medical History:  Diagnosis Date   Allergic rhinitis due to pollen    Bilateral carpal tunnel syndrome    Type II or unspecified type diabetes mellitus without mention of complication, not stated as uncontrolled     Past Surgical History:  Procedure Laterality Date   APPENDECTOMY  1976   CARPAL TUNNEL RELEASE Bilateral 11/24/13   Dr Reita Chard   COLONOSCOPY   10/05/2013   FOOT SURGERY  2004   right heel reconstruction   POLYPECTOMY      Family History  Problem Relation Age of Onset   Diabetes Brother    Kidney failure Brother    Cancer Neg Hx    Colon cancer Neg Hx    Esophageal cancer Neg Hx    Rectal cancer Neg Hx    Stomach cancer Neg Hx    Colon polyps Neg Hx     Social History   Socioeconomic History   Marital status: Married    Spouse name: Not on file   Number of children: 3   Years of education: Not on file   Highest education level: Not on file  Occupational History   Occupation: owns Corporate investment banker  Tobacco Use   Smoking status: Never    Passive exposure: Past   Smokeless tobacco: Never  Vaping Use   Vaping Use: Never used  Substance and Sexual Activity   Alcohol use: No   Drug use: No   Sexual activity: Yes  Other Topics Concern   Not on file  Social History Narrative   Not on file   Social Determinants of Health   Financial Resource Strain: Not on file  Food Insecurity: Not on file  Transportation Needs: Not on file  Physical Activity: Not on file  Stress: Not  on file  Social Connections: Not on file  Intimate Partner Violence: Not on file   Review of Systems Weight is back up 18#--but appetite has gone down since starting the [facility] Same work responsibility for the most part--may be driving more and less on site work (but not much different)     Objective:   Physical Exam Constitutional:      Appearance: Normal appearance.  Cardiovascular:     Rate and Rhythm: Normal rate and regular rhythm.     Pulses: Normal pulses.     Heart sounds: No murmur heard.    No gallop.  Pulmonary:     Effort: Pulmonary effort is normal.     Breath sounds: Normal breath sounds. No wheezing or rales.  Musculoskeletal:     Cervical back: Neck supple.     Comments: Knees are not swollen No ligament or meniscus findings Only mild crepitus and good ROM  Feet invert significantly  when standing  Lymphadenopathy:     Cervical: No cervical adenopathy.  Skin:    Comments: No foot lesions  Neurological:     Mental Status: He is alert.     Comments: Fairly normal sensation in feet  Psychiatric:        Mood and Affect: Mood normal.        Behavior: Behavior normal.            Assessment & Plan:

## 2023-02-01 ENCOUNTER — Encounter: Payer: Self-pay | Admitting: Internal Medicine

## 2023-02-04 MED ORDER — SEMAGLUTIDE (1 MG/DOSE) 4 MG/3ML ~~LOC~~ SOPN: 1.0000 mg | PEN_INJECTOR | SUBCUTANEOUS | 11 refills | Status: DC

## 2023-02-27 DIAGNOSIS — E113291 Type 2 diabetes mellitus with mild nonproliferative diabetic retinopathy without macular edema, right eye: Secondary | ICD-10-CM | POA: Diagnosis not present

## 2023-02-27 DIAGNOSIS — H2513 Age-related nuclear cataract, bilateral: Secondary | ICD-10-CM | POA: Diagnosis not present

## 2023-02-27 DIAGNOSIS — H524 Presbyopia: Secondary | ICD-10-CM | POA: Diagnosis not present

## 2023-02-28 MED ORDER — CONTOUR NEXT TEST VI STRP: ORAL_STRIP | 12 refills | Status: AC

## 2023-03-04 LAB — HM DIABETES EYE EXAM

## 2023-03-10 NOTE — Progress Notes (Unsigned)
    Elexus Barman T. Pearlee Arvizu, MD, CAQ Sports Medicine [facility] [address] [place], [address]  Phone: [phone]  FAX: [phone]  Derrick Kennedy - 62 y.o. male  MRN [id number]  Date of Birth: April 24, 1961  Date: 03/11/2023  PCP: Karie Schwalbe, MD  Referral: Karie Schwalbe, MD  No chief complaint on file.  Subjective:   Derrick Kennedy is a 62 y.o. very pleasant male patient with There is no height or weight on file to calculate BMI. who presents with the following:  Patient presents with ongoing bilateral knee pain.    Review of Systems is noted in the HPI, as appropriate  Objective:   There were no vitals taken for this visit.  GEN: No acute distress; alert,appropriate. PULM: Breathing comfortably in no respiratory distress PSYCH: Normally interactive.   Laboratory and Imaging Data:  Assessment and Plan:   ***

## 2023-03-11 ENCOUNTER — Ambulatory Visit: Payer: BC Managed Care – PPO | Admitting: Family Medicine

## 2023-03-11 VITALS — BP 124/78 | HR 87 | Temp 98.0°F | Ht 69.0 in | Wt 274.0 lb

## 2023-03-11 DIAGNOSIS — M1712 Unilateral primary osteoarthritis, left knee: Secondary | ICD-10-CM

## 2023-03-11 MED ORDER — TRIAMCINOLONE ACETONIDE 40 MG/ML IJ SUSP
40.0000 mg | Freq: Once | INTRAMUSCULAR | Status: AC
  Administered 2023-03-11: 40 mg via INTRA_ARTICULAR

## 2023-03-11 MED ORDER — CELECOXIB 200 MG PO CAPS: 200.0000 mg | ORAL_CAPSULE | Freq: Every day | ORAL | 2 refills | Status: DC

## 2023-03-11 MED ORDER — TRIAMCINOLONE ACETONIDE 40 MG/ML IJ SUSP: 40.0000 mg | Freq: Once | INTRAMUSCULAR | Status: DC

## 2023-03-12 ENCOUNTER — Encounter: Payer: Self-pay | Admitting: Family Medicine

## 2023-03-15 ENCOUNTER — Other Ambulatory Visit: Payer: Self-pay | Admitting: Internal Medicine

## 2023-03-25 ENCOUNTER — Other Ambulatory Visit: Payer: Self-pay | Admitting: Internal Medicine

## 2023-04-30 ENCOUNTER — Ambulatory Visit (INDEPENDENT_AMBULATORY_CARE_PROVIDER_SITE_OTHER): Payer: BC Managed Care – PPO | Admitting: Internal Medicine

## 2023-04-30 ENCOUNTER — Encounter: Payer: Self-pay | Admitting: Internal Medicine

## 2023-04-30 VITALS — BP 116/74 | HR 94 | Temp 98.0°F | Ht 70.0 in | Wt 263.0 lb

## 2023-04-30 DIAGNOSIS — Z7985 Long-term (current) use of injectable non-insulin antidiabetic drugs: Secondary | ICD-10-CM | POA: Diagnosis not present

## 2023-04-30 DIAGNOSIS — Z23 Encounter for immunization: Secondary | ICD-10-CM | POA: Diagnosis not present

## 2023-04-30 DIAGNOSIS — Z125 Encounter for screening for malignant neoplasm of prostate: Secondary | ICD-10-CM | POA: Diagnosis not present

## 2023-04-30 DIAGNOSIS — E1142 Type 2 diabetes mellitus with diabetic polyneuropathy: Secondary | ICD-10-CM | POA: Diagnosis not present

## 2023-04-30 DIAGNOSIS — Z0000 Encounter for general adult medical examination without abnormal findings: Secondary | ICD-10-CM

## 2023-04-30 DIAGNOSIS — Z7984 Long term (current) use of oral hypoglycemic drugs: Secondary | ICD-10-CM

## 2023-04-30 LAB — COMPREHENSIVE METABOLIC PANEL
ALT: 38 U/L (ref 0–53)
AST: 30 U/L (ref 0–37)
Albumin: 4.4 g/dL (ref 3.5–5.2)
Alkaline Phosphatase: 33 U/L — ABNORMAL LOW (ref 39–117)
BUN: 23 mg/dL (ref 6–23)
CO2: 26 mEq/L (ref 19–32)
Calcium: 9.7 mg/dL (ref 8.4–10.5)
Chloride: 102 mEq/L (ref 96–112)
Creatinine, Ser: 0.96 mg/dL (ref 0.40–1.50)
GFR: 85.38 mL/min (ref >60.00)
Glucose, Bld: 140 mg/dL — ABNORMAL HIGH (ref 70–99)
Potassium: 4.3 mEq/L (ref 3.5–5.1)
Sodium: 137 mEq/L (ref 135–145)
Total Bilirubin: 0.4 mg/dL (ref 0.2–1.2)
Total Protein: 7.4 g/dL (ref 6.0–8.3)

## 2023-04-30 LAB — CBC
HCT: 40.1 % (ref 39.0–52.0)
Hemoglobin: 13.3 g/dL (ref 13.0–17.0)
MCHC: 33.1 g/dL (ref 30.0–36.0)
MCV: 93 fl (ref 78.0–100.0)
Platelets: 318 10*3/uL (ref 150.0–400.0)
RBC: 4.31 Mil/uL (ref 4.22–5.81)
RDW: 14.2 % (ref 11.5–15.5)
WBC: 7 10*3/uL (ref 4.0–10.5)

## 2023-04-30 LAB — MICROALBUMIN / CREATININE URINE RATIO
Creatinine,U: 172.7 mg/dL
Microalb Creat Ratio: 0.8 mg/g (ref 0.0–30.0)
Microalb, Ur: 1.3 mg/dL (ref 0.0–1.9)

## 2023-04-30 LAB — LIPID PANEL
Cholesterol: 75 mg/dL (ref 0–200)
HDL: 35 mg/dL — ABNORMAL LOW (ref >39.00)
LDL Cholesterol: 25 mg/dL (ref 0–99)
NonHDL: 39.94
Total CHOL/HDL Ratio: 2
Triglycerides: 73 mg/dL (ref 0.0–149.0)
VLDL: 14.6 mg/dL (ref 0.0–40.0)

## 2023-04-30 LAB — PSA: PSA: 0.74 ng/mL (ref 0.10–4.00)

## 2023-04-30 LAB — HEMOGLOBIN A1C: Hgb A1c MFr Bld: 7 % — ABNORMAL HIGH (ref 4.6–6.5)

## 2023-04-30 LAB — HM DIABETES FOOT EXAM

## 2023-04-30 MED ORDER — ROSUVASTATIN CALCIUM 20 MG PO TABS: 20.0000 mg | ORAL_TABLET | Freq: Every day | ORAL | 3 refills | Status: DC

## 2023-04-30 NOTE — Progress Notes (Signed)
Subjective:    Patient ID: Derrick Kennedy, male    DOB: Apr 14, 1961, 62 y.o.   MRN: [id number]  HPI Here for physical  Tolerating the ozempic No major side effects--some nausea the first couple of days No dental infections now --that had messed his sugars up before AM sugars 119-135-----------------night 150-170 (bedtime) Mild right foot numbness/tingling---mostly after prolonged time on them  Otherwise doing okay  Current Outpatient Medications on File Prior to Visit  Medication Sig Dispense Refill   glipiZIDE (GLUCOTROL XL) 10 MG 24 hr tablet TAKE 1 TABLET(10 MG) BY MOUTH DAILY WITH BREAKFAST 90 tablet 3   glucose blood (CONTOUR NEXT TEST) test strip Use to check blood sugar one to 2 times daily. 100 each 12   Insulin Pen Needle 31G X 8 MM MISC 1 each by Does not apply route daily. 100 each 3   loratadine (CLARITIN) 10 MG tablet Take 10 mg by mouth as needed for allergies.     metFORMIN (GLUCOPHAGE-XR) 750 MG 24 hr tablet TAKE 3 TABLETS(2250 MG) BY MOUTH DAILY WITH BREAKFAST 270 tablet 0   rosuvastatin (CRESTOR) 20 MG tablet Take 1 tablet (20 mg total) by mouth daily. 90 tablet 3   Semaglutide, 1 MG/DOSE, 4 MG/3ML SOPN Inject 1 mg as directed once a week. 3 mL 11   celecoxib (CELEBREX) 200 MG capsule Take 1 capsule (200 mg total) by mouth daily. (Patient not taking: Reported on 04/30/2023) 30 capsule 2   No current facility-administered medications on file prior to visit.    Allergies  Allergen Reactions   Other     Past Medical History:  Diagnosis Date   Allergic rhinitis due to pollen    Bilateral carpal tunnel syndrome    Type II or unspecified type diabetes mellitus without mention of complication, not stated as uncontrolled     Past Surgical History:  Procedure Laterality Date   APPENDECTOMY  1976   CARPAL TUNNEL RELEASE Bilateral 11/24/13   Dr Reita Chard   COLONOSCOPY  10/05/2013   FOOT SURGERY  2004   right heel reconstruction   POLYPECTOMY      Family  History  Problem Relation Age of Onset   Diabetes Brother    Kidney failure Brother    Cancer Neg Hx    Colon cancer Neg Hx    Esophageal cancer Neg Hx    Rectal cancer Neg Hx    Stomach cancer Neg Hx    Colon polyps Neg Hx     Social History   Socioeconomic History   Marital status: Married    Spouse name: Not on file   Number of children: 3   Years of education: Not on file   Highest education level: 12th grade  Occupational History   Occupation: owns Corporate investment banker  Tobacco Use   Smoking status: Never    Passive exposure: Past   Smokeless tobacco: Never  Vaping Use   Vaping status: Never Used  Substance and Sexual Activity   Alcohol use: No   Drug use: No   Sexual activity: Yes  Other Topics Concern   Not on file  Social History Narrative   Not on file   Social Determinants of Health   Financial Resource Strain: Low Risk  (03/11/2023)   Overall Financial Resource Strain (CARDIA)    Difficulty of Paying Living Expenses: Not hard at all  Food Insecurity: No Food Insecurity (03/11/2023)   Hunger Vital Sign    Worried About Running Out  of Food in the Last Year: Never true    Ran Out of Food in the Last Year: Never true  Transportation Needs: No Transportation Needs (03/11/2023)   PRAPARE - Administrator, Civil Service (Medical): No    Lack of Transportation (Non-Medical): No  Physical Activity: Insufficiently Active (03/11/2023)   Exercise Vital Sign    Days of Exercise per Week: 3 days    Minutes of Exercise per Session: 30 min  Stress: No Stress Concern Present (03/11/2023)   [name]    Feeling of Stress : Not at all  Social Connections: Socially Integrated (03/11/2023)   Social Connection and Isolation Panel [NHANES]    Frequency of Communication with Friends and Family: More than three times a week    Frequency of Social Gatherings with Friends and Family:  More than three times a week    Attends Religious Services: More than 4 times per year    Active Member of [name] or Organizations: Yes    Attends Engineer, structural: More than 4 times per year    Marital Status: Married  Catering manager Violence: Not on file   Review of Systems  Constitutional:  Negative for fatigue.       Weight down 15# Wears seat belt  HENT:  Negative for tinnitus.        1 suspect tooth --may need to be pulled  Eyes:  Negative for visual disturbance.       No diplopia or unilateral vision loss  Respiratory:  Negative for chest tightness and shortness of breath.        Brief cough with temperature changes  Cardiovascular:  Negative for chest pain, palpitations and leg swelling.  Gastrointestinal:  Negative for blood in stool and constipation.       No heartburn  Endocrine: Negative for polydipsia and polyuria.  Genitourinary:  Negative for difficulty urinating and urgency.       Nocturia x 1 Flow is okay No sexual problems  Musculoskeletal:  Negative for back pain and joint swelling.       Recent left knee injection--helped  Skin:  Negative for rash.       No suspicious lesions  Allergic/Immunologic: Positive for environmental allergies. Negative for immunocompromised state.       Symptoms only with dust exposure--will wear mask  Neurological:  Negative for dizziness, syncope, light-headedness and headaches.  Hematological:  Negative for adenopathy. Does not bruise/bleed easily.  Psychiatric/Behavioral:  Negative for dysphoric mood and sleep disturbance. The patient is not nervous/anxious.        Objective:   Physical Exam Constitutional:      Appearance: Normal appearance.  HENT:     Mouth/Throat:     Pharynx: No oropharyngeal exudate or posterior oropharyngeal erythema.  Eyes:     Conjunctiva/sclera: Conjunctivae normal.     Pupils: Pupils are equal, round, and reactive to light.  Cardiovascular:     Rate and Rhythm: Normal rate and  regular rhythm.     Pulses: Normal pulses.     Heart sounds: No murmur heard.    No gallop.  Pulmonary:     Effort: Pulmonary effort is normal.     Breath sounds: Normal breath sounds. No wheezing or rales.  Abdominal:     Palpations: Abdomen is soft.     Tenderness: There is no abdominal tenderness.  Musculoskeletal:     Cervical back: Neck supple.  Right lower leg: No edema.     Left lower leg: No edema.  Lymphadenopathy:     Cervical: No cervical adenopathy.  Skin:    Findings: No lesion or rash.     Comments: No foot lesions  Neurological:     General: No focal deficit present.     Mental Status: He is alert and oriented to person, place, and time.     Comments: Fairly normal sensation in feet  Psychiatric:        Mood and Affect: Mood normal.        Behavior: Behavior normal.            Assessment & Plan:

## 2023-04-30 NOTE — Assessment & Plan Note (Signed)
Control seems better now on ozempic 1mg  weekly Some nausea so wont try the 2mg  unless A1c still over 7.5% Metformin 2250 daily and glipizide 10 daily

## 2023-04-30 NOTE — Addendum Note (Signed)
Addended by: Eual Fines on: 04/30/2023 10:33 AM   Modules accepted: Orders

## 2023-04-30 NOTE — Assessment & Plan Note (Signed)
Discussed increasing exercise Colon due 2030 Will recheck PSA Shingrix today Recommended flu vaccine--he will consider Doesn't take COVID vaccines

## 2023-06-13 ENCOUNTER — Other Ambulatory Visit: Payer: Self-pay | Admitting: Internal Medicine

## 2023-07-10 ENCOUNTER — Encounter: Payer: Self-pay | Admitting: Family Medicine

## 2023-07-10 ENCOUNTER — Ambulatory Visit: Payer: BC Managed Care – PPO | Admitting: Family Medicine

## 2023-07-10 ENCOUNTER — Ambulatory Visit (INDEPENDENT_AMBULATORY_CARE_PROVIDER_SITE_OTHER)
Admission: RE | Admit: 2023-07-10 | Discharge: 2023-07-10 | Disposition: A | Discharge status: Home / Self Care | Payer: Self-pay | Source: Ambulatory Visit | Attending: Family Medicine | Admitting: Family Medicine

## 2023-07-10 VITALS — BP 130/72 | HR 79 | Temp 97.6°F | Ht 70.0 in | Wt 271.2 lb

## 2023-07-10 DIAGNOSIS — M25562 Pain in left knee: Secondary | ICD-10-CM

## 2023-07-10 DIAGNOSIS — M1712 Unilateral primary osteoarthritis, left knee: Secondary | ICD-10-CM

## 2023-07-10 DIAGNOSIS — G8929 Other chronic pain: Secondary | ICD-10-CM

## 2023-07-10 MED ORDER — TRIAMCINOLONE ACETONIDE 40 MG/ML IJ SUSP
40.0000 mg | Freq: Once | INTRAMUSCULAR | Status: AC
Start: 2023-07-10 — End: 2023-07-10
  Administered 2023-07-10: 40 mg via INTRAMUSCULAR

## 2023-07-10 NOTE — Progress Notes (Signed)
Derrick Kennedy T. Derrick Alfieri, MD, CAQ Sports Medicine [facility] at [facility] [address] Derrick Kennedy [place], [address]  Phone: [phone]  FAX: [phone]  [name] - 62 y.o. male  MRN [id number]  Date of Birth: 1961/02/28  Date: 07/10/2023  PCP: Karie Schwalbe, MD  Referral: Karie Schwalbe, MD  Chief Complaint  Patient presents with   Knee Pain    Left Knee x 3 weeks but. Trouble sleeping at night can get to 9/10. Normal is 3/10. Pain is can be sharp and at times dull constant pain.    Subjective:   Derrick Kennedy is a 62 y.o. very pleasant male patient with Body mass index is 38.91 kg/m. who presents with the following:  The patient is here for follow-up.  I saw him in June, at that point I thought that he was having left greater than right osteoarthritis flare.  At that point, he was having pain in the medial compartment.  I did do an intra-articular injection which provided significant relief and started him on Celebrex.  Knee had been doing fairly well until about 3 weeks ago he had a slip and since then he has been having some significant pain in the medial compartment.  He has not had any effusions, locking up of the joint or functional giving way.  No significant history of prior surgery.  He has tried some ice, he think this actually made it hurt more.  Review of Systems is noted in the HPI, as appropriate  Objective:   BP 130/72   Pulse 79   Temp 97.6 F (36.4 C) (Oral)   Ht 5\' 10"  (1.778 m)   Wt 271 lb 3.2 oz (123 kg)   SpO2 98%   BMI 38.91 kg/m   GEN: No acute distress; alert,appropriate. PULM: Breathing comfortably in no respiratory distress PSYCH: Normally interactive.   Left knee: Full extension.  Flexion 125.  No effusion. Minimal pain at the patellar facets He does have some medial compartmental pain and a mild pain at the medial plical band. Stable to varus and valgus stress ACL and PCL are intact Strength is 5/5  throughout and neurovascularly intact.  Bounce home testing is normal He does have some significant pain with McMurray's and with flexion pinch testing.  Laboratory and Imaging Data:  Assessment and Plan:     ICD-10-CM   1. Primary osteoarthritis of left knee  M17.12 triamcinolone acetonide (KENALOG-40) injection 40 mg    2. Chronic pain of left knee  M25.562 DG Knee 4 Views W/Patella Left   G89.29      On weightbearing knee films, the patient does have advanced medial compartmental osteoarthritis on the left side.  Overall, he does have tricompartmental arthritis but is more mild in character compared to the medial compartment.  He also has mild right-sided medial compartmental OA.  I went over a number of different conservative measures that he could try detailed below.  Also gave him a comprehensive osteoarthritis workout program for the knee and lower extremity.  We will do also an intra-articular injection today  Aspiration/Injection Procedure Note Derrick Kennedy Jan 20, 1961 Date of procedure: 07/10/2023  Procedure: Large Joint Aspiration / Injection of Knee, L Indications: Pain  Procedure Details Patient verbally consented to procedure. Risks, benefits, and alternatives explained. Sterilely prepped with Chloraprep. Ethyl cholride used for anesthesia. 9 cc Lidocaine 1% mixed with 1 mL of Kenalog 40 mg injected using the anteromedial approach without difficulty. No  complications with procedure and tolerated well. Patient had decreased pain post-injection. Medication: 1 mL of Kenalog 40 mg   Patient Instructions  OSTEOARTHRITIS: Over the counter  Tylenol: 2 tablets up to 3-4 times a day Regular NSAIDS are helpful (avoid in kidney disease and ulcers)  Topical Capzaicin Cream, as needed (wear glove to put on) - THIS IS EXCEPTIONALLY HOT  Supplements: Tart cherry juice and Curcumin (Turmeric extract) have good scientific evidence  - get the concentrated capsules or gelcaps  over the counter so you do not get the calories from the juice.  Weight loss will always take stress off of the joints and back  Voltaren 1% gel, over the counter You can apply up to 4 times a day  This can be applied to any joint: knee, wrist, fingers, elbows, shoulders, feet and ankles. Can apply to any tendon: tennis elbow, achilles, tendon, rotator cuff or any other tendon.  Minimal is absorbed in the bloodstream: ok with oral anti-inflammatory or a blood thinner.  Cost is about 9 dollars  Ice joints on bad days, 20 min, 2-3 x / day REGULAR EXERCISE: swimming, Yoga, Tai Chi, bicycle (NON-IMPACT activity)     Medication Management during today's office visit: Meds ordered this encounter  Medications   triamcinolone acetonide (KENALOG-40) injection 40 mg   There are no discontinued medications.  Orders placed today for conditions managed today: Orders Placed This Encounter  Procedures   DG Knee 4 Views W/Patella Left    Disposition: No follow-ups on file.  Dragon Medical One speech-to-text software was used for transcription in this dictation.  Possible transcriptional errors can occur using Animal nutritionist.   Signed,  Elpidio Galea. Abriella Filkins, MD   Outpatient Encounter Medications as of 07/10/2023  Medication Sig   celecoxib (CELEBREX) 200 MG capsule Take 1 capsule (200 mg total) by mouth daily.   glipiZIDE (GLUCOTROL XL) 10 MG 24 hr tablet TAKE 1 TABLET(10 MG) BY MOUTH DAILY WITH BREAKFAST   glucose blood (CONTOUR NEXT TEST) test strip Use to check blood sugar one to 2 times daily.   Insulin Pen Needle 31G X 8 MM MISC 1 each by Does not apply route daily.   loratadine (CLARITIN) 10 MG tablet Take 10 mg by mouth as needed for allergies.   metFORMIN (GLUCOPHAGE-XR) 750 MG 24 hr tablet TAKE 3 TABLETS(2250 MG) BY MOUTH DAILY WITH BREAKFAST   rosuvastatin (CRESTOR) 20 MG tablet Take 1 tablet (20 mg total) by mouth daily.   Semaglutide, 1 MG/DOSE, 4 MG/3ML SOPN Inject 1 mg as  directed once a week.   [EXPIRED] triamcinolone acetonide (KENALOG-40) injection 40 mg    No facility-administered encounter medications on file as of 07/10/2023.

## 2023-07-10 NOTE — Patient Instructions (Signed)
OSTEOARTHRITIS: Over the counter  Tylenol: 2 tablets up to 3-4 times a day Regular NSAIDS are helpful (avoid in kidney disease and ulcers)  Topical Capzaicin Cream, as needed (wear glove to put on) - THIS IS EXCEPTIONALLY HOT  Supplements: Tart cherry juice and Curcumin (Turmeric extract) have good scientific evidence  - get the concentrated capsules or gelcaps over the counter so you do not get the calories from the juice.  Weight loss will always take stress off of the joints and back  Voltaren 1% gel, over the counter You can apply up to 4 times a day  This can be applied to any joint: knee, wrist, fingers, elbows, shoulders, feet and ankles. Can apply to any tendon: tennis elbow, achilles, tendon, rotator cuff or any other tendon.  Minimal is absorbed in the bloodstream: ok with oral anti-inflammatory or a blood thinner.  Cost is about 9 dollars  Ice joints on bad days, 20 min, 2-3 x / day REGULAR EXERCISE: swimming, Yoga, Tai Chi, bicycle (NON-IMPACT activity)   

## 2023-09-09 ENCOUNTER — Other Ambulatory Visit: Payer: Self-pay | Admitting: Family Medicine

## 2023-09-09 NOTE — Telephone Encounter (Signed)
Last office visit 07/10/2023 with Dr. Patsy Lager for knee pain.  Last refilled 03/11/2023 for #30 with 2 refills.  Next Appt: 10/31/23 with Dr. Alphonsus Sias (PCP)

## 2023-10-31 ENCOUNTER — Ambulatory Visit: Payer: Self-pay | Admitting: Internal Medicine

## 2024-01-15 ENCOUNTER — Encounter: Payer: Self-pay | Admitting: Family Medicine

## 2024-01-15 NOTE — Telephone Encounter (Signed)
 CPT Code is 20610, injection/arthrocentesis of a large joint.  Derrick Kennedy, could you help the patient figure out what his out of pocket cost would be?  For this, I would just use the injection code - no need for an OV.

## 2024-01-20 ENCOUNTER — Ambulatory Visit (INDEPENDENT_AMBULATORY_CARE_PROVIDER_SITE_OTHER): Payer: Self-pay | Admitting: Family Medicine

## 2024-01-20 ENCOUNTER — Encounter: Payer: Self-pay | Admitting: Family Medicine

## 2024-01-20 VITALS — BP 120/70 | HR 85 | Temp 98.0°F | Ht 70.0 in | Wt 273.0 lb

## 2024-01-20 DIAGNOSIS — M1712 Unilateral primary osteoarthritis, left knee: Secondary | ICD-10-CM

## 2024-01-20 MED ORDER — TRIAMCINOLONE ACETONIDE 40 MG/ML IJ SUSP
40.0000 mg | Freq: Once | INTRAMUSCULAR | Status: AC
Start: 2024-01-20 — End: 2024-01-20
  Administered 2024-01-20: 40 mg via INTRA_ARTICULAR

## 2024-01-20 NOTE — Progress Notes (Signed)
     Derrick Brew [name], MD, CAQ Sports Medicine [facility] at [facility] [address] [place], [address]  Phone: [phone]  FAX: [phone]  Derrick Kennedy - 63 y.o. male  MRN [id number]  Date of Birth: 05/25/1961  Date: 01/20/2024  PCP: Helaine Llanos, MD  Referral: Helaine Llanos, MD  Chief Complaint  Patient presents with   Knee Pain    Left   Subjective:   Derrick Kennedy is a 63 y.o. very pleasant male patient with Body mass index is 39.17 kg/m. who presents with the following:  Proc only    ICD-10-CM   1. Primary osteoarthritis of left knee  M17.12      Aspiration/Injection Procedure Note Derrick Kennedy 06/14/1961 Date of procedure: 01/20/2024  Procedure: Large Joint Aspiration / Injection of Knee, L Indications: Pain  Procedure Details Patient verbally consented to procedure. Risks, benefits, and alternatives explained. Sterilely prepped with Chloraprep. Ethyl cholride used for anesthesia. 9 cc Lidocaine 1% mixed with 1 mL of Kenalog  40 mg injected using the anteromedial approach without difficulty. No complications with procedure and tolerated well. Patient had decreased pain post-injection. Medication: 1 mL of Kenalog  40 mg   Signed,  Cleon Signorelli T. Greysyn Vanderberg, MD   Outpatient Encounter Medications as of 01/20/2024  Medication Sig   celecoxib  (CELEBREX ) 200 MG capsule TAKE 1 CAPSULE(200 MG) BY MOUTH DAILY   glipiZIDE  (GLUCOTROL  XL) 10 MG 24 hr tablet TAKE 1 TABLET(10 MG) BY MOUTH DAILY WITH BREAKFAST   glucose blood (CONTOUR NEXT TEST) test strip Use to check blood sugar one to 2 times daily.   Insulin  Pen Needle 31G X 8 MM MISC 1 each by Does not apply route daily.   loratadine (CLARITIN) 10 MG tablet Take 10 mg by mouth as needed for allergies.   metFORMIN  (GLUCOPHAGE -XR) 750 MG 24 hr tablet Take 1,500 mg by mouth daily with breakfast.   rosuvastatin  (CRESTOR ) 20 MG tablet Take 1 tablet (20 mg total) by mouth daily.    [DISCONTINUED] metFORMIN  (GLUCOPHAGE -XR) 750 MG 24 hr tablet TAKE 3 TABLETS(2250 MG) BY MOUTH DAILY WITH BREAKFAST (Patient taking differently: 1,500 mg daily with breakfast.)   [DISCONTINUED] Semaglutide , 1 MG/DOSE, 4 MG/3ML SOPN Inject 1 mg as directed once a week. (Patient not taking: Reported on 01/20/2024)   No facility-administered encounter medications on file as of 01/20/2024.

## 2024-01-20 NOTE — Addendum Note (Signed)
 Addended by: Wyn Heater on: 01/20/2024 02:58 PM   Modules accepted: Orders

## 2024-04-21 ENCOUNTER — Other Ambulatory Visit: Payer: Self-pay | Admitting: Internal Medicine

## 2024-06-12 ENCOUNTER — Other Ambulatory Visit: Payer: Self-pay | Admitting: *Deleted

## 2024-06-12 NOTE — Telephone Encounter (Signed)
 We received a fax refill request from [facility], however pt is overdue for a CPE, please schedule a CPE/ TOC appt and then route to the [facility] to get refilled

## 2024-06-15 NOTE — Telephone Encounter (Signed)
 lvm for pt to call office to schedule appt.

## 2024-06-22 NOTE — Telephone Encounter (Signed)
Lvm/sent mychart

## 2024-06-23 NOTE — Telephone Encounter (Signed)
 Lvm to schedule

## 2024-06-24 MED ORDER — METFORMIN HCL ER 750 MG PO TB24: 2250.0000 mg | ORAL_TABLET | Freq: Every day | ORAL | 0 refills | Status: DC

## 2024-06-24 NOTE — Telephone Encounter (Signed)
Sent rx for 30 days

## 2024-07-01 ENCOUNTER — Ambulatory Visit (INDEPENDENT_AMBULATORY_CARE_PROVIDER_SITE_OTHER): Payer: Self-pay

## 2024-07-01 VITALS — BP 138/78 | HR 76 | Temp 97.8°F | Ht 70.0 in | Wt 273.0 lb

## 2024-07-01 DIAGNOSIS — E1142 Type 2 diabetes mellitus with diabetic polyneuropathy: Secondary | ICD-10-CM

## 2024-07-01 DIAGNOSIS — Z6839 Body mass index (BMI) 39.0-39.9, adult: Secondary | ICD-10-CM

## 2024-07-01 DIAGNOSIS — M79672 Pain in left foot: Secondary | ICD-10-CM

## 2024-07-01 DIAGNOSIS — Z7984 Long term (current) use of oral hypoglycemic drugs: Secondary | ICD-10-CM

## 2024-07-01 MED ORDER — LISINOPRIL 5 MG PO TABS: 5.0000 mg | ORAL_TABLET | Freq: Every day | ORAL | 1 refills | Status: AC

## 2024-07-01 MED ORDER — METFORMIN HCL ER 750 MG PO TB24: 750.0000 mg | ORAL_TABLET | Freq: Three times a day (TID) | ORAL | 1 refills | Status: AC

## 2024-07-01 MED ORDER — LANTUS SOLOSTAR 100 UNIT/ML ~~LOC~~ SOPN: 20.0000 [IU] | PEN_INJECTOR | Freq: Every day | SUBCUTANEOUS | 2 refills | Status: AC

## 2024-07-01 MED ORDER — ROSUVASTATIN CALCIUM 40 MG PO TABS: 40.0000 mg | ORAL_TABLET | Freq: Every evening | ORAL | 1 refills | Status: AC

## 2024-07-01 NOTE — Progress Notes (Signed)
 Subjective:    Patient ID: Derrick Kennedy, male    DOB: 1961/03/15, 63 y.o.   MRN: [id number]  HPI  Derrick Kennedy is a very pleasant 63 y.o. male who presents today for DM visit. Been out of glipizide  for a month. Taking statin twice a week. -Denies polydipsia, polyuria, weight loss, fatigue or any LE wounds.  Left foot pain when walking, sharp, shooting, worsened, improved by being off feet, wears workboots, intalling things manually, pain on top of foot, advil helps a little bit, also swelling in one spot    Current A1c: 9.1 Last A1c: 7.0 Current meds: metformin , glipizide  Past meds: Glipizide , metformin , ozempic  Neuropathy: Yes Nephropathy: No Retinopathy: No   Urine microalbumin: Needs Eye exam: Yes at [facility] Foot exam: Needs  Ace-i/ARB: Started Statin: Yes Influenza vaccine: Declines Pneumococcal vaccine:Defers Diet: Lots of meat, cut soda, eats pretzels Weight concerns: Yes    Review of Systems  All other systems reviewed and are negative.        Past Medical History:  Diagnosis Date   Allergic rhinitis due to pollen    Bilateral carpal tunnel syndrome    Type II or unspecified type diabetes mellitus without mention of complication, not stated as uncontrolled     Social History   Socioeconomic History   Marital status: Married    Spouse name: Not on file   Number of children: 3   Years of education: Not on file   Highest education level: 12th grade  Occupational History   Occupation: owns Corporate investment banker  Tobacco Use   Smoking status: Never    Passive exposure: Past   Smokeless tobacco: Never  Vaping Use   Vaping status: Never Used  Substance and Sexual Activity   Alcohol use: No   Drug use: No   Sexual activity: Yes  Other Topics Concern   Not on file  Social History Narrative   Not on file   Social Drivers of Health   Financial Resource Strain: Low Risk  (03/11/2023)   Overall Financial Resource Strain  (CARDIA)    Difficulty of Paying Living Expenses: Not hard at all  Food Insecurity: No Food Insecurity (03/11/2023)   Hunger Vital Sign    Worried About Running Out of Food in the Last Year: Never true    Ran Out of Food in the Last Year: Never true  Transportation Needs: No Transportation Needs (03/11/2023)   PRAPARE - Administrator, Civil Service (Medical): No    Lack of Transportation (Non-Medical): No  Physical Activity: Insufficiently Active (03/11/2023)   Exercise Vital Sign    Days of Exercise per Week: 3 days    Minutes of Exercise per Session: 30 min  Stress: No Stress Concern Present (03/11/2023)   [name]    Feeling of Stress : Not at all  Social Connections: Socially Integrated (03/11/2023)   Social Connection and Isolation Panel    Frequency of Communication with Friends and Family: More than three times a week    Frequency of Social Gatherings with Friends and Family: More than three times a week    Attends Religious Services: More than 4 times per year    Active Member of [name] or Organizations: Yes    Attends Engineer, structural: More than 4 times per year    Marital Status: Married  Catering manager Violence: Not on file    Past Surgical History:  Procedure  Laterality Date   APPENDECTOMY  1976   CARPAL TUNNEL RELEASE Bilateral 11/24/13   Dr Medford Sharps   COLONOSCOPY  10/05/2013   FOOT SURGERY  2004   right heel reconstruction   POLYPECTOMY      Family History  Problem Relation Age of Onset   Diabetes Brother    Kidney failure Brother    Cancer Neg Hx    Colon cancer Neg Hx    Esophageal cancer Neg Hx    Rectal cancer Neg Hx    Stomach cancer Neg Hx    Colon polyps Neg Hx     Allergies  Allergen Reactions   Dust Mite Extract Other (See Comments)   Other     Current Outpatient Medications on File Prior to Visit  Medication Sig Dispense Refill   celecoxib   (CELEBREX ) 200 MG capsule TAKE 1 CAPSULE(200 MG) BY MOUTH DAILY 30 capsule 2   glucose blood (CONTOUR NEXT TEST) test strip Use to check blood sugar one to 2 times daily. 100 each 12   loratadine (CLARITIN) 10 MG tablet Take 10 mg by mouth as needed for allergies. (Patient taking differently: Take 10 mg by mouth daily.)     No current facility-administered medications on file prior to visit.    BP 138/78 (BP Location: Left Arm, Patient Position: Sitting, Cuff Size: Large)   Pulse 76   Temp 97.8 F (36.6 C) (Oral)   Ht 5' 10 (1.778 m)   Wt 273 lb (123.8 kg)   SpO2 95%   BMI 39.17 kg/m  Objective:   Physical Exam Vitals and nursing note reviewed.  Constitutional:      Appearance: Normal appearance. He is obese.  HENT:     Head: Normocephalic and atraumatic.  Eyes:     Extraocular Movements: Extraocular movements intact.     Conjunctiva/sclera: Conjunctivae normal.  Cardiovascular:     Pulses:          Dorsalis pedis pulses are 2+ on the right side and 2+ on the left side.       Posterior tibial pulses are 2+ on the right side and 2+ on the left side.  Musculoskeletal:       Feet:  Feet:     Right foot:     Protective Sensation: 10 sites tested.  10 sites sensed.     Skin integrity: Skin integrity normal.     Toenail Condition: Right toenails are abnormally thick.     Left foot:     Protective Sensation: 10 sites tested.  10 sites sensed.     Skin integrity: Skin integrity normal.     Toenail Condition: Left toenails are abnormally thick.     Comments: Tenderness to palpation over the dorsum of the foot just inferior to base of the third toe, associated swelling in the surrounding area Varicose veins visible on the lower legs and feet bilaterally Skin:    General: Skin is warm.  Neurological:     Mental Status: He is alert.  Psychiatric:        Mood and Affect: Mood normal.        Behavior: Behavior normal.           Assessment & Plan:   1. Type 2 DM with  diabetic neuropathy affecting both sides of body (HCC) (Primary) 2.  Morbid obesity Patient presents for diabetic visit, A1c significantly worsened to 9.1 from 7 prior.  In part due to medication noncompliance.  Extensively discussed about GLP-1s, which  she was on in the past given BMI of 39, but was discontinued due to financial reasons, patient is self-pay.  Will start Lantus at this visit for better glucose control, and discontinue glipizide  to minimize risk of hypoglycemia.  Plan for patient to check fasting morning sugars and send via MyChart, can uptitrate Lantus dose after 3 days. Urine microalbumin ordered as he is due.  Counseled extensively about associated risk factor HLD and importance of daily statin use, discussed potential side effects, patient is agreeable to start, high intensity statin ordered below.  Extensively discussed about ACE inhibitor and renal protection, associated side effects versus benefits, agreeable to start lisinopril, ordered below. Patient counseled about warning signs of hypoglycemia. Declines flu vaccine this visit, agreeable to Prevnar but would like to defer to next visit. Second dose of Shingrix  given this visit. Counseled about healthy diet/lifestyle changes, will attempt to improve BMI this way.  - Microalbumin / creatinine urine ratio - metFORMIN  (GLUCOPHAGE -XR) 750 MG 24 hr tablet; Take 1 tablet (750 mg total) by mouth in the morning, at noon, and at bedtime.  Dispense: 270 tablet; Refill: 1 - insulin  glargine (LANTUS SOLOSTAR) 100 UNIT/ML Solostar Pen; Inject 20 Units into the skin at bedtime.  Dispense: 15 mL; Refill: 2 - rosuvastatin  (CRESTOR ) 40 MG tablet; Take 1 tablet (40 mg total) by mouth at bedtime.  Dispense: 90 tablet; Refill: 1 - lisinopril (ZESTRIL) 5 MG tablet; Take 1 tablet (5 mg total) by mouth daily.  Dispense: 90 tablet; Refill: 1  3. Left foot pain Patient complaining of left foot pain and some mild swelling, in a specific location over  the dorsum of the left foot, see physical exam.  Unlikely to be neuropathic pain given localization of the pain, may be inflammation secondary to unsuitable footwear, counseled patient to use insoles which she has already bought. The surrounding swelling over the area extends across a larger area than the spot of localized tenderness, may be secondary to venous insufficiency, counseled patient to elevate legs daily in the evenings.  Return in about 6 weeks (around 08/12/2024) for CPE.  Plan for next diabetic visit in 3 months.  Kenaz Olafson K Retal Tonkinson, MD  07/01/24

## 2024-07-01 NOTE — Patient Instructions (Addendum)
 Thank you for visiting [facility] today! Here's what we talked about: - Start Lantus 20 units at night, check morning sugars daily, send to me after 3 days - Low sugar symptoms are dizziness, tremors, fatigue, feeling sweaty, confused  - Stop Glipizide  - Start Lisinopril daily - Walk 3-5 times a week - Start Crestor  nightly

## 2024-07-02 LAB — MICROALBUMIN / CREATININE URINE RATIO
Creatinine,U: 110 mg/dL
Microalb Creat Ratio: 13.8 mg/g (ref 0.0–30.0)
Microalb, Ur: 1.5 mg/dL (ref 0.0–1.9)

## 2024-07-06 ENCOUNTER — Ambulatory Visit: Payer: Self-pay

## 2024-08-26 ENCOUNTER — Ambulatory Visit: Payer: Self-pay

## 2024-08-26 NOTE — Telephone Encounter (Signed)
 3rd attempt to reach patient. Mailbox full, unable to leave message.

## 2024-08-26 NOTE — Telephone Encounter (Signed)
 Copied from CRM [id number]. Topic: Clinical - Red Word Triage >> Aug 26, 2024  9:38 AM Deaijah H wrote: Red Word that prompted transfer to Nurse Triage: Injection in knee, knee pain   PAS reported that pt disconnected while waiting therefore pt no longer on line: will place this encounter in callback folder

## 2024-08-26 NOTE — Telephone Encounter (Signed)
2nd attempt to reach patient, mailbox full unable to leave message

## 2024-10-21 NOTE — Progress Notes (Unsigned)
" ° ° ° °  Derrick Devora T. Derrick Simeone, MD, CAQ Sports Medicine [facility] at [facility] [address] [place], [address]  Phone: [phone]  FAX: [phone]  Derrick Kennedy - 64 y.o. male  MRN [id number]  Date of Birth: [birth year]  Date: 10/22/2024  PCP: Derrick Reuben POUR, MD  Referral: Derrick Reuben POUR, MD  No chief complaint on file.  Subjective:   Derrick Kennedy is a 64 y.o. very pleasant male patient with There is no height or weight on file to calculate BMI. who presents with the following:  Discussed the use of AI scribe software for clinical note transcription with the patient, who gave verbal consent to proceed.  Patient presents with ongoing left-sided knee pain.  I saw him last April 2025 for left-sided knee osteoarthritis.  At that point I did an intra-articular knee injection. History of Present Illness     Review of Systems is noted in the HPI, as appropriate  Objective:   There were no vitals taken for this visit.  GEN: No acute distress; alert,appropriate. PULM: Breathing comfortably in no respiratory distress PSYCH: Normally interactive.   Laboratory and Imaging Data:  Assessment and Plan:   No diagnosis found. Assessment & Plan   Medication Management during today's office visit: No orders of the defined types were placed in this encounter.  There are no discontinued medications.  Orders placed today for conditions managed today: No orders of the defined types were placed in this encounter.   Disposition: No follow-ups on file.  Dragon Medical One speech-to-text software was used for transcription in this dictation.  Possible transcriptional errors can occur using Animal nutritionist.   Signed,  Derrick Kennedy. Derrick Maiden, MD   Outpatient Encounter Medications as of 10/22/2024  Medication Sig   celecoxib  (CELEBREX ) 200 MG capsule TAKE 1 CAPSULE(200 MG) BY MOUTH DAILY   glucose blood (CONTOUR NEXT TEST) test strip Use to check blood sugar  one to 2 times daily.   insulin  glargine (LANTUS  SOLOSTAR) 100 UNIT/ML Solostar Pen Inject 20 Units into the skin at bedtime.   lisinopril  (ZESTRIL ) 5 MG tablet Take 1 tablet (5 mg total) by mouth daily.   loratadine (CLARITIN) 10 MG tablet Take 10 mg by mouth as needed for allergies. (Patient taking differently: Take 10 mg by mouth daily.)   metFORMIN  (GLUCOPHAGE -XR) 750 MG 24 hr tablet Take 1 tablet (750 mg total) by mouth in the morning, at noon, and at bedtime.   rosuvastatin  (CRESTOR ) 40 MG tablet Take 1 tablet (40 mg total) by mouth at bedtime.   No facility-administered encounter medications on file as of 10/22/2024.   "

## 2024-10-22 ENCOUNTER — Encounter: Payer: Self-pay | Admitting: Family Medicine

## 2024-10-22 ENCOUNTER — Ambulatory Visit: Payer: Self-pay | Admitting: Family Medicine

## 2024-10-22 VITALS — BP 120/70 | HR 87 | Temp 97.5°F | Ht 70.0 in | Wt 263.0 lb

## 2024-10-22 DIAGNOSIS — M1712 Unilateral primary osteoarthritis, left knee: Secondary | ICD-10-CM

## 2024-10-22 MED ORDER — TRIAMCINOLONE ACETONIDE 40 MG/ML IJ SUSP
40.0000 mg | Freq: Once | INTRAMUSCULAR | Status: AC
  Administered 2024-10-22: 40 mg via INTRA_ARTICULAR
